# Patient Record
Sex: Female | Born: 1965 | Race: Black or African American | Hispanic: No | Marital: Single | Smoking: Current some day smoker
Health system: Southern US, Community
[De-identification: ages and names within clinical notes are randomized; demographics above are authoritative.]

## PROBLEM LIST (undated history)

## (undated) DIAGNOSIS — M545 Low back pain, unspecified: Secondary | ICD-10-CM

## (undated) DIAGNOSIS — I10 Essential (primary) hypertension: Secondary | ICD-10-CM

## (undated) DIAGNOSIS — M542 Cervicalgia: Secondary | ICD-10-CM

## (undated) DIAGNOSIS — F419 Anxiety disorder, unspecified: Secondary | ICD-10-CM

## (undated) DIAGNOSIS — J302 Other seasonal allergic rhinitis: Secondary | ICD-10-CM

## (undated) HISTORY — DX: Essential (primary) hypertension: I10

---

## 1995-08-31 HISTORY — PX: TUBAL LIGATION: SHX77

## 1997-10-20 ENCOUNTER — Encounter: Admission: RE | Admit: 1997-10-20 | Discharge: 1997-10-20 | Payer: Self-pay | Admitting: Family Medicine

## 1998-07-11 ENCOUNTER — Encounter: Admission: RE | Admit: 1998-07-11 | Discharge: 1998-07-11 | Payer: Self-pay | Admitting: Family Medicine

## 1998-07-13 ENCOUNTER — Encounter: Admission: RE | Admit: 1998-07-13 | Discharge: 1998-07-13 | Payer: Self-pay | Admitting: Family Medicine

## 1999-10-31 ENCOUNTER — Encounter (INDEPENDENT_AMBULATORY_CARE_PROVIDER_SITE_OTHER): Payer: Self-pay | Admitting: *Deleted

## 1999-11-23 ENCOUNTER — Encounter: Admission: RE | Admit: 1999-11-23 | Discharge: 1999-11-23 | Payer: Self-pay | Admitting: Family Medicine

## 1999-11-23 ENCOUNTER — Other Ambulatory Visit: Admission: RE | Admit: 1999-11-23 | Discharge: 1999-11-23 | Payer: Self-pay | Admitting: Family Medicine

## 2000-03-26 ENCOUNTER — Encounter: Admission: RE | Admit: 2000-03-26 | Discharge: 2000-03-26 | Payer: Self-pay | Admitting: Family Medicine

## 2000-09-05 ENCOUNTER — Encounter: Admission: RE | Admit: 2000-09-05 | Discharge: 2000-09-05 | Payer: Self-pay | Admitting: Family Medicine

## 2000-10-18 ENCOUNTER — Encounter: Admission: RE | Admit: 2000-10-18 | Discharge: 2000-10-18 | Payer: Self-pay | Admitting: Family Medicine

## 2000-12-06 ENCOUNTER — Encounter: Admission: RE | Admit: 2000-12-06 | Discharge: 2000-12-06 | Payer: Self-pay | Admitting: Family Medicine

## 2001-08-26 ENCOUNTER — Encounter: Admission: RE | Admit: 2001-08-26 | Discharge: 2001-08-26 | Payer: Self-pay | Admitting: Sports Medicine

## 2002-02-05 ENCOUNTER — Encounter: Admission: RE | Admit: 2002-02-05 | Discharge: 2002-02-05 | Payer: Self-pay | Admitting: Family Medicine

## 2002-03-22 ENCOUNTER — Encounter: Payer: Self-pay | Admitting: Emergency Medicine

## 2002-03-22 ENCOUNTER — Emergency Department (HOSPITAL_COMMUNITY): Admission: EM | Admit: 2002-03-22 | Discharge: 2002-03-22 | Payer: Self-pay | Admitting: Emergency Medicine

## 2002-03-25 ENCOUNTER — Ambulatory Visit (HOSPITAL_BASED_OUTPATIENT_CLINIC_OR_DEPARTMENT_OTHER): Admission: RE | Admit: 2002-03-25 | Discharge: 2002-03-25 | Payer: Self-pay | Admitting: *Deleted

## 2002-05-25 ENCOUNTER — Encounter: Admission: RE | Admit: 2002-05-25 | Discharge: 2002-05-25 | Payer: Self-pay | Admitting: Family Medicine

## 2002-06-05 ENCOUNTER — Encounter: Admission: RE | Admit: 2002-06-05 | Discharge: 2002-06-05 | Payer: Self-pay | Admitting: Family Medicine

## 2002-07-07 ENCOUNTER — Encounter: Admission: RE | Admit: 2002-07-07 | Discharge: 2002-07-07 | Payer: Self-pay | Admitting: Family Medicine

## 2003-02-01 ENCOUNTER — Encounter: Admission: RE | Admit: 2003-02-01 | Discharge: 2003-02-01 | Payer: Self-pay | Admitting: Family Medicine

## 2005-03-19 ENCOUNTER — Ambulatory Visit: Payer: Self-pay | Admitting: Sports Medicine

## 2005-05-05 ENCOUNTER — Emergency Department (HOSPITAL_COMMUNITY): Admission: EM | Admit: 2005-05-05 | Discharge: 2005-05-05 | Payer: Self-pay | Admitting: Emergency Medicine

## 2005-07-26 ENCOUNTER — Ambulatory Visit: Payer: Self-pay | Admitting: Sports Medicine

## 2005-07-27 ENCOUNTER — Emergency Department (HOSPITAL_COMMUNITY): Admission: EM | Admit: 2005-07-27 | Discharge: 2005-07-27 | Payer: Self-pay | Admitting: Emergency Medicine

## 2006-08-29 DIAGNOSIS — J309 Allergic rhinitis, unspecified: Secondary | ICD-10-CM | POA: Insufficient documentation

## 2006-08-30 ENCOUNTER — Encounter (INDEPENDENT_AMBULATORY_CARE_PROVIDER_SITE_OTHER): Payer: Self-pay | Admitting: *Deleted

## 2007-03-04 ENCOUNTER — Emergency Department (HOSPITAL_COMMUNITY): Admission: EM | Admit: 2007-03-04 | Discharge: 2007-03-04 | Payer: Self-pay | Admitting: *Deleted

## 2007-10-17 ENCOUNTER — Emergency Department (HOSPITAL_COMMUNITY): Admission: EM | Admit: 2007-10-17 | Discharge: 2007-10-17 | Payer: Self-pay | Admitting: Emergency Medicine

## 2008-02-09 ENCOUNTER — Telehealth: Payer: Self-pay | Admitting: *Deleted

## 2010-11-17 NOTE — Op Note (Signed)
   NAMEMARKEETA, Claire Fowler                        ACCOUNT NO.:  [id number]   MEDICAL RECORD NO.:  [id number]                   PATIENT TYPE:  AMB   LOCATION:  DSC                                  FACILITY:  [facility]   PHYSICIAN:  Lowell Bouton, M.D.      DATE OF BIRTH:  [birth year]   DATE OF PROCEDURE:  03/25/2002  DATE OF DISCHARGE:                                 OPERATIVE REPORT   PREOPERATIVE DIAGNOSIS:  Lacerated extensor tendon on the dorsum of the left  hand in line with the ring finger.   POSTOPERATIVE DIAGNOSIS:  Lacerated extensor tendon on the dorsum of the  left hand in line with the ring finger.   PROCEDURE:  Repair of extensor tendon on the dorsum of the left hand to the  ring finger.   SURGEON:  Lowell Bouton, M.D.   ANESTHESIA:  General.   OPERATIVE FINDINGS:  The patient had a complete transection of the extensor  digitorum communis on the left hand to the ring finger.   DESCRIPTION OF PROCEDURE:  Under general anesthesia with a tourniquet on the  left arm, the left hand was prepped and draped in the usual fashion and  after elevating the limb, the tourniquet was inflated to 250 mmHg.  The  previous sutures were removed and the wound was bluntly spread open.  It was  extended radially with a knife slightly.  Blunt dissection was carried down  to the extensor tendon and clotted blood was removed from the wound.  The  two ends of the tendon were separated by about 2 cm.  Ethibond 3-0 suture  was used in a Kessler fashion to repair the tendon end-to-end.  The repair  was then reinforced with simple sutures.  The wound was then irrigated with  saline.  The skin was closed with a 3-0 subcuticular Prolene, Steri-Strips  were applied, followed by sterile dressings and a volar splint with the  fingers extended on the ulnar three digits.  The tourniquet was released  with good circulation to the hand.  The patient went to the recovery room  awake and  stable, in good condition.                                                Lowell Bouton, M.D.    EMM/MEDQ  D:  03/25/2002  T:  03/26/2002  Job:  [id number]

## 2011-09-05 ENCOUNTER — Emergency Department (HOSPITAL_COMMUNITY): Payer: Self-pay

## 2011-09-05 ENCOUNTER — Encounter (HOSPITAL_COMMUNITY): Payer: Self-pay

## 2011-09-05 ENCOUNTER — Emergency Department (HOSPITAL_COMMUNITY)
Admission: EM | Admit: 2011-09-05 | Discharge: 2011-09-05 | Disposition: A | Discharge status: Home / Self Care | Payer: Self-pay | Attending: Emergency Medicine | Admitting: Emergency Medicine

## 2011-09-05 DIAGNOSIS — M7710 Lateral epicondylitis, unspecified elbow: Secondary | ICD-10-CM

## 2011-09-05 DIAGNOSIS — IMO0002 Reserved for concepts with insufficient information to code with codable children: Secondary | ICD-10-CM | POA: Insufficient documentation

## 2011-09-05 DIAGNOSIS — M25529 Pain in unspecified elbow: Secondary | ICD-10-CM | POA: Insufficient documentation

## 2011-09-05 DIAGNOSIS — Y9289 Other specified places as the place of occurrence of the external cause: Secondary | ICD-10-CM | POA: Insufficient documentation

## 2011-09-05 DIAGNOSIS — F17200 Nicotine dependence, unspecified, uncomplicated: Secondary | ICD-10-CM | POA: Insufficient documentation

## 2011-09-05 MED ORDER — IBUPROFEN 800 MG PO TABS
800.0000 mg | ORAL_TABLET | Freq: Once | ORAL | Status: AC
  Administered 2011-09-05: 800 mg via ORAL
  Filled 2011-09-05: qty 1

## 2011-09-05 MED ORDER — NAPROXEN 500 MG PO TABS: 500.0000 mg | ORAL_TABLET | Freq: Two times a day (BID) | ORAL | Status: AC

## 2011-09-05 NOTE — ED Notes (Signed)
Pt reports hitting her rt elbow on the wall this a.m.  Pt reports severe pain to the area.

## 2011-09-05 NOTE — Discharge Instructions (Signed)
Tennis Elbow  Your caregiver has diagnosed you with a condition often referred to as "tennis elbow." This results from small tears or soreness (inflammation) at the start (origin) of the extensor muscles of the forearm. Although the condition is often called tennis or golfer's elbow, it is caused by any repetitive action performed by your elbow.  HOME CARE INSTRUCTIONS   If the condition has been short lived, rest may be the only treatment required. Using your opposite hand or arm to perform the task may help. Even changing your grip may help rest the extremity. These may even prevent the condition from recurring.   Longer standing problems, however, will often be relieved faster by:   Using anti-inflammatory agents.   Applying ice packs for 30 minutes at the end of the working day, at bed time, or when activities are finished.   Your caregiver may also have you wear a splint or sling. This will allow the inflamed tendon to heal.  At times, steroid injections aided with a local anesthetic will be required along with splinting for 1 to 2 weeks. Two to three steroid injections will often solve the problem. In some long standing cases, the inflamed tendon does not respond to conservative (non-surgical) therapy. Then surgery may be required to repair it.  MAKE SURE YOU:    Understand these instructions.   Will watch your condition.   Will get help right away if you are not doing well or get worse.  Document Released: 06/18/2005 Document Revised: 06/07/2011 Document Reviewed: 02/04/2008  [employer] Patient Information 2012 [employer].
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
## 2011-09-09 NOTE — ED Provider Notes (Signed)
Medical screening examination/treatment/procedure(s) were performed by non-physician practitioner and as supervising physician I was immediately available for consultation/collaboration.   Shelda Jakes, MD 09/09/11 2043

## 2014-10-11 ENCOUNTER — Observation Stay (HOSPITAL_COMMUNITY)
Admission: EM | Admit: 2014-10-11 | Discharge: 2014-10-12 | Disposition: A | Discharge status: Home / Self Care | Payer: No Typology Code available for payment source | Attending: Internal Medicine | Admitting: Internal Medicine

## 2014-10-11 ENCOUNTER — Emergency Department (HOSPITAL_COMMUNITY): Payer: No Typology Code available for payment source

## 2014-10-11 ENCOUNTER — Encounter (HOSPITAL_COMMUNITY): Payer: Self-pay | Admitting: Cardiology

## 2014-10-11 DIAGNOSIS — R9431 Abnormal electrocardiogram [ECG] [EKG]: Secondary | ICD-10-CM | POA: Diagnosis not present

## 2014-10-11 DIAGNOSIS — Z720 Tobacco use: Secondary | ICD-10-CM | POA: Insufficient documentation

## 2014-10-11 DIAGNOSIS — R55 Syncope and collapse: Secondary | ICD-10-CM | POA: Diagnosis not present

## 2014-10-11 DIAGNOSIS — S299XXA Unspecified injury of thorax, initial encounter: Principal | ICD-10-CM | POA: Insufficient documentation

## 2014-10-11 DIAGNOSIS — F419 Anxiety disorder, unspecified: Secondary | ICD-10-CM

## 2014-10-11 DIAGNOSIS — S0990XA Unspecified injury of head, initial encounter: Secondary | ICD-10-CM | POA: Diagnosis not present

## 2014-10-11 DIAGNOSIS — I10 Essential (primary) hypertension: Secondary | ICD-10-CM

## 2014-10-11 DIAGNOSIS — Y9389 Activity, other specified: Secondary | ICD-10-CM | POA: Insufficient documentation

## 2014-10-11 DIAGNOSIS — S199XXA Unspecified injury of neck, initial encounter: Secondary | ICD-10-CM | POA: Insufficient documentation

## 2014-10-11 DIAGNOSIS — S3991XA Unspecified injury of abdomen, initial encounter: Secondary | ICD-10-CM | POA: Diagnosis not present

## 2014-10-11 DIAGNOSIS — Y998 Other external cause status: Secondary | ICD-10-CM | POA: Insufficient documentation

## 2014-10-11 DIAGNOSIS — Y92410 Unspecified street and highway as the place of occurrence of the external cause: Secondary | ICD-10-CM | POA: Insufficient documentation

## 2014-10-11 HISTORY — DX: Other seasonal allergic rhinitis: J30.2

## 2014-10-11 HISTORY — DX: Anxiety disorder, unspecified: F41.9

## 2014-10-11 HISTORY — DX: Essential (primary) hypertension: I10

## 2014-10-11 LAB — CBC WITH DIFFERENTIAL/PLATELET
BASOS PCT: 0 % (ref 0–1)
Basophils Absolute: 0 10*3/uL (ref 0.0–0.1)
EOS PCT: 0 % (ref 0–5)
Eosinophils Absolute: 0 10*3/uL (ref 0.0–0.7)
HCT: 34.2 % — ABNORMAL LOW (ref 36.0–46.0)
HEMOGLOBIN: 11.1 g/dL — AB (ref 12.0–15.0)
LYMPHS PCT: 23 % (ref 12–46)
Lymphs Abs: 2.2 10*3/uL (ref 0.7–4.0)
MCH: 22.5 pg — ABNORMAL LOW (ref 26.0–34.0)
MCHC: 32.5 g/dL (ref 30.0–36.0)
MCV: 69.2 fL — ABNORMAL LOW (ref 78.0–100.0)
MONO ABS: 0.5 10*3/uL (ref 0.1–1.0)
MONOS PCT: 5 % (ref 3–12)
Neutro Abs: 6.7 10*3/uL (ref 1.7–7.7)
Neutrophils Relative %: 72 % (ref 43–77)
Platelets: 361 10*3/uL (ref 150–400)
RBC: 4.94 MIL/uL (ref 3.87–5.11)
RDW: 14.4 % (ref 11.5–15.5)
WBC: 9.4 10*3/uL (ref 4.0–10.5)

## 2014-10-11 LAB — BASIC METABOLIC PANEL
Anion gap: 9 (ref 5–15)
BUN: 11 mg/dL (ref 6–23)
CHLORIDE: 106 mmol/L (ref 96–112)
CO2: 22 mmol/L (ref 19–32)
Calcium: 8.7 mg/dL (ref 8.4–10.5)
Creatinine, Ser: 0.97 mg/dL (ref 0.50–1.10)
GFR calc Af Amer: 79 mL/min — ABNORMAL LOW (ref >90)
GFR, EST NON AFRICAN AMERICAN: 68 mL/min — AB (ref >90)
GLUCOSE: 109 mg/dL — AB (ref 70–99)
POTASSIUM: 3.8 mmol/L (ref 3.5–5.1)
SODIUM: 137 mmol/L (ref 135–145)

## 2014-10-11 LAB — TROPONIN I: Troponin I: <0.03 ng/mL (ref <0.031)

## 2014-10-11 MED ORDER — SODIUM CHLORIDE 0.9 % IV SOLN
INTRAVENOUS | Status: DC
  Administered 2014-10-11: 23:00:00 via INTRAVENOUS

## 2014-10-11 MED ORDER — ACETAMINOPHEN 650 MG RE SUPP: 650.0000 mg | Freq: Four times a day (QID) | RECTAL | Status: DC | PRN

## 2014-10-11 MED ORDER — ACETAMINOPHEN 325 MG PO TABS
650.0000 mg | ORAL_TABLET | Freq: Four times a day (QID) | ORAL | Status: DC | PRN
  Administered 2014-10-12: 650 mg via ORAL
  Filled 2014-10-11: qty 2

## 2014-10-11 MED ORDER — ONDANSETRON HCL 4 MG/2ML IJ SOLN: 4.0000 mg | Freq: Four times a day (QID) | INTRAMUSCULAR | Status: DC | PRN

## 2014-10-11 MED ORDER — ALPRAZOLAM 0.5 MG PO TABS
0.5000 mg | ORAL_TABLET | Freq: Three times a day (TID) | ORAL | Status: DC | PRN
  Administered 2014-10-12: 0.5 mg via ORAL
  Filled 2014-10-11: qty 1

## 2014-10-11 MED ORDER — IOHEXOL 300 MG/ML  SOLN
100.0000 mL | Freq: Once | INTRAMUSCULAR | Status: AC | PRN
  Administered 2014-10-11: 100 mL via INTRAVENOUS

## 2014-10-11 MED ORDER — SODIUM CHLORIDE 0.9 % IV BOLUS (SEPSIS)
500.0000 mL | Freq: Once | INTRAVENOUS | Status: AC
  Administered 2014-10-11: 500 mL via INTRAVENOUS

## 2014-10-11 MED ORDER — ONDANSETRON HCL 4 MG PO TABS: 4.0000 mg | ORAL_TABLET | Freq: Four times a day (QID) | ORAL | Status: DC | PRN

## 2014-10-11 MED ORDER — ENOXAPARIN SODIUM 40 MG/0.4ML ~~LOC~~ SOLN
40.0000 mg | SUBCUTANEOUS | Status: DC
  Administered 2014-10-12: 40 mg via SUBCUTANEOUS
  Filled 2014-10-11: qty 0.4

## 2014-10-11 MED ORDER — LORATADINE 10 MG PO TABS
10.0000 mg | ORAL_TABLET | Freq: Every day | ORAL | Status: DC
  Administered 2014-10-12: 10 mg via ORAL
  Filled 2014-10-11: qty 1

## 2014-10-11 MED ORDER — ACETAMINOPHEN 325 MG PO TABS
650.0000 mg | ORAL_TABLET | Freq: Once | ORAL | Status: AC
  Administered 2014-10-11: 650 mg via ORAL
  Filled 2014-10-11: qty 2
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
## 2014-10-12 ENCOUNTER — Encounter (HOSPITAL_COMMUNITY): Payer: Self-pay | Admitting: Adult Health

## 2014-10-12 DIAGNOSIS — R9431 Abnormal electrocardiogram [ECG] [EKG]: Secondary | ICD-10-CM

## 2014-10-12 DIAGNOSIS — R55 Syncope and collapse: Secondary | ICD-10-CM

## 2014-10-12 DIAGNOSIS — F419 Anxiety disorder, unspecified: Secondary | ICD-10-CM

## 2014-10-12 DIAGNOSIS — I10 Essential (primary) hypertension: Secondary | ICD-10-CM

## 2014-10-12 LAB — CBC
HCT: 33.1 % — ABNORMAL LOW (ref 36.0–46.0)
HEMOGLOBIN: 10.5 g/dL — AB (ref 12.0–15.0)
MCH: 22.2 pg — AB (ref 26.0–34.0)
MCHC: 31.7 g/dL (ref 30.0–36.0)
MCV: 70.1 fL — ABNORMAL LOW (ref 78.0–100.0)
Platelets: 336 10*3/uL (ref 150–400)
RBC: 4.72 MIL/uL (ref 3.87–5.11)
RDW: 14.5 % (ref 11.5–15.5)
WBC: 6.8 10*3/uL (ref 4.0–10.5)

## 2014-10-12 LAB — COMPREHENSIVE METABOLIC PANEL
ALK PHOS: 44 U/L (ref 39–117)
ALT: 17 U/L (ref 0–35)
AST: 19 U/L (ref 0–37)
Albumin: 3.5 g/dL (ref 3.5–5.2)
Anion gap: 8 (ref 5–15)
BILIRUBIN TOTAL: 0.2 mg/dL — AB (ref 0.3–1.2)
BUN: 9 mg/dL (ref 6–23)
CO2: 23 mmol/L (ref 19–32)
Calcium: 8.1 mg/dL — ABNORMAL LOW (ref 8.4–10.5)
Chloride: 108 mmol/L (ref 96–112)
Creatinine, Ser: 0.93 mg/dL (ref 0.50–1.10)
GFR calc Af Amer: 84 mL/min — ABNORMAL LOW (ref >90)
GFR calc non Af Amer: 72 mL/min — ABNORMAL LOW (ref >90)
Glucose, Bld: 107 mg/dL — ABNORMAL HIGH (ref 70–99)
Potassium: 3.6 mmol/L (ref 3.5–5.1)
Sodium: 139 mmol/L (ref 135–145)
TOTAL PROTEIN: 6.2 g/dL (ref 6.0–8.3)

## 2014-10-12 LAB — TROPONIN I
Troponin I: <0.03 ng/mL (ref <0.031)
Troponin I: <0.03 ng/mL (ref <0.031)
Troponin I: <0.03 ng/mL (ref <0.031)

## 2014-10-12 MED ORDER — IBUPROFEN 600 MG PO TABS
600.0000 mg | ORAL_TABLET | Freq: Four times a day (QID) | ORAL | Status: DC | PRN
  Administered 2014-10-12: 600 mg via ORAL
  Filled 2014-10-12: qty 1

## 2014-10-12 NOTE — Care Management Note (Signed)
    Page 1 of 1   10/12/2014     2:23:58 PM CARE MANAGEMENT NOTE 10/12/2014  Patient:  Sheffield SliderCROW,Claire Fowler   Account Number:  [id number]  Date Initiated:  10/12/2014  Documentation initiated by:  Sharrie RothmanBLACKWELL,Venesha Petraitis C  Subjective/Objective Assessment:   Pt admitted from home with syncope. Pt lives with significant other and will return home at discharge. Pt is independent with ADL's.     Action/Plan:   [facility] and [facility]. [facility] Financial counselor aware of self pay status.   Anticipated DC Date:  10/13/2014   Anticipated DC Plan:  HOME/SELF CARE  In-house referral  Financial Counselor      DC Planning Services  CM consult      Choice offered to / List presented to:             Status of service:  Completed, signed off Medicare Important Message given?   (If response is "NO", the following Medicare IM given date fields will be blank) Date Medicare IM given:   Medicare IM given by:   Date Additional Medicare IM given:   Additional Medicare IM given by:    Discharge Disposition:  HOME/SELF CARE  Per UR Regulation:    If discussed at Long Length of Stay Meetings, dates discussed:    Comments:  10/12/14 1415 Arlyss Queenammy Barbara Ahart, RN BSN CM

## 2014-10-12 NOTE — Progress Notes (Signed)
  Echocardiogram 2D Echocardiogram has been performed.  Stacey DrainWhite, Uliana Brinker J 10/12/2014, 4:28 PM

## 2014-10-12 NOTE — Progress Notes (Signed)
UR completed 
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
## 2014-10-12 NOTE — Progress Notes (Signed)
Patient in stable condition at discharge.  Patient received discharge instructions and had no further questions or concerns.  Patient escorted to main entrance via wheelchair by nurse tech.

## 2014-10-12 NOTE — Progress Notes (Signed)
    Please see echocardiogram report - vigorous LVEF without wall motion abnormalities, normal RV contraction, and no pericardial effusion. Orthostatic vital signs not significant. Troponin I negative x 3. If no arrhythmias on telemetry since we evaluated her this morning, can consider discharge. Possibly had neurocardiogenic syncope. She needs to establish soon with a PCP in the area, and will also likely need an outpatient stress test for further cardiac screening in light of abnormal ECG. We will sign off - can arrange followup with us for stress testing as outpatient.   Jonelle SidleSamuel G. Prescilla Monger, M.D., F.A.C.C.
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
## 2014-10-27 ENCOUNTER — Ambulatory Visit (INDEPENDENT_AMBULATORY_CARE_PROVIDER_SITE_OTHER): Payer: Self-pay | Admitting: Physician Assistant

## 2014-10-27 ENCOUNTER — Encounter: Payer: Self-pay | Admitting: *Deleted

## 2014-10-27 ENCOUNTER — Encounter: Payer: Self-pay | Admitting: Physician Assistant

## 2014-10-27 VITALS — BP 142/78 | HR 99 | Ht 63.0 in | Wt 171.0 lb

## 2014-10-27 DIAGNOSIS — Z136 Encounter for screening for cardiovascular disorders: Secondary | ICD-10-CM

## 2014-10-27 DIAGNOSIS — R9431 Abnormal electrocardiogram [ECG] [EKG]: Secondary | ICD-10-CM

## 2014-10-27 DIAGNOSIS — I10 Essential (primary) hypertension: Secondary | ICD-10-CM

## 2014-10-27 DIAGNOSIS — R55 Syncope and collapse: Secondary | ICD-10-CM

## 2014-10-27 MED ORDER — BENAZEPRIL HCL 10 MG PO TABS: 10.0000 mg | ORAL_TABLET | Freq: Every day | ORAL | Status: DC

## 2014-10-27 NOTE — Assessment & Plan Note (Signed)
Patient has had another episode of syncope but without for 2 hours. Recommend referral to neurology. She's had associated severe headaches. We'll check stress Myoview to rule out ischemia but doubt this is the cause of her symptoms.

## 2014-10-27 NOTE — Addendum Note (Signed)
Addended by: Kerney ElbePINNIX, LUKISHA G on: 10/27/2014 03:54 PM   Modules accepted: Orders
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
## 2014-10-27 NOTE — Assessment & Plan Note (Signed)
2-D echo shows vigorous LV function. Will order stress Myoview to rule out ischemia. Follow-up with Dr. Diona BrownerMcDowell.

## 2014-10-27 NOTE — Patient Instructions (Addendum)
Your physician recommends that you schedule a follow-up appointment in: 1 month with Dr. Diona BrownerMcDowell  Your physician has requested that you have en exercise stress myoview. For further information please visit [email or web address]. Please follow instruction sheet, as given.  You have been given a copy of a 2 GM Salt Diet   Start Lotensin 10 mg Daily   Thank you for choosing [facility]!

## 2014-10-27 NOTE — Assessment & Plan Note (Signed)
Blood pressure is elevated and she is having significant headaches. Start Lotensin 10 mg once daily. 2 g sodium diet. Weight loss program recommended.

## 2014-11-01 ENCOUNTER — Encounter: Payer: Self-pay | Admitting: Adult Health

## 2014-11-03 ENCOUNTER — Telehealth: Payer: Self-pay | Admitting: Physician Assistant

## 2014-11-03 ENCOUNTER — Other Ambulatory Visit: Payer: Self-pay

## 2014-11-03 DIAGNOSIS — R51 Headache: Secondary | ICD-10-CM

## 2014-11-03 DIAGNOSIS — R55 Syncope and collapse: Secondary | ICD-10-CM

## 2014-11-03 DIAGNOSIS — R519 Headache, unspecified: Secondary | ICD-10-CM

## 2014-11-03 NOTE — Telephone Encounter (Signed)
[facility]-C referral placed to neurology,Terry Goins aware

## 2014-11-03 NOTE — Telephone Encounter (Signed)
Patient states that she was told she was being referred to Neurologist. There is no referral in system.  Can you verify this. / tg

## 2014-11-05 ENCOUNTER — Encounter (HOSPITAL_COMMUNITY): Payer: Self-pay

## 2014-11-05 ENCOUNTER — Ambulatory Visit (HOSPITAL_COMMUNITY): Admission: RE | Admit: 2014-11-05 | Payer: Self-pay | Source: Ambulatory Visit

## 2014-11-09 ENCOUNTER — Encounter (HOSPITAL_COMMUNITY): Payer: Self-pay | Admitting: *Deleted

## 2014-11-09 ENCOUNTER — Emergency Department (HOSPITAL_COMMUNITY)
Admission: EM | Admit: 2014-11-09 | Discharge: 2014-11-09 | Disposition: A | Discharge status: Home / Self Care | Payer: No Typology Code available for payment source | Attending: Emergency Medicine | Admitting: Emergency Medicine

## 2014-11-09 DIAGNOSIS — Z8659 Personal history of other mental and behavioral disorders: Secondary | ICD-10-CM | POA: Diagnosis not present

## 2014-11-09 DIAGNOSIS — Z79899 Other long term (current) drug therapy: Secondary | ICD-10-CM | POA: Insufficient documentation

## 2014-11-09 DIAGNOSIS — M549 Dorsalgia, unspecified: Secondary | ICD-10-CM | POA: Diagnosis present

## 2014-11-09 DIAGNOSIS — Z720 Tobacco use: Secondary | ICD-10-CM | POA: Diagnosis not present

## 2014-11-09 DIAGNOSIS — M545 Low back pain, unspecified: Secondary | ICD-10-CM

## 2014-11-09 MED ORDER — CYCLOBENZAPRINE HCL 10 MG PO TABS: 10.0000 mg | ORAL_TABLET | Freq: Three times a day (TID) | ORAL | Status: DC | PRN

## 2014-11-09 MED ORDER — TRAMADOL HCL 50 MG PO TABS: 50.0000 mg | ORAL_TABLET | Freq: Four times a day (QID) | ORAL | Status: DC | PRN

## 2014-11-09 NOTE — Discharge Instructions (Signed)
Back Pain, Adult °Back pain is very common. The pain often gets better over time. The cause of back pain is usually not dangerous. Most people can learn to manage their back pain on their own.  °HOME CARE  °· Stay active. Start with short walks on flat ground if you can. Try to walk farther each day. °· Do not sit, drive, or stand in one place for more than 30 minutes. Do not stay in bed. °· Do not avoid exercise or work. Activity can help your back heal faster. °· Be careful when you bend or lift an object. Bend at your knees, keep the object close to you, and do not twist. °· Sleep on a firm mattress. Lie on your side, and bend your knees. If you lie on your back, put a pillow under your knees. °· Only take medicines as told by your doctor. °· Put ice on the injured area. °¨ Put ice in a plastic bag. °¨ Place a towel between your skin and the bag. °¨ Leave the ice on for 15-20 minutes, 03-04 times a day for the first 2 to 3 days. After that, you can switch between ice and heat packs. °· Ask your doctor about back exercises or massage. °· Avoid feeling anxious or stressed. Find good ways to deal with stress, such as exercise. °GET HELP RIGHT AWAY IF:  °· Your pain does not go away with rest or medicine. °· Your pain does not go away in 1 week. °· You have new problems. °· You do not feel well. °· The pain spreads into your legs. °· You cannot control when you poop (bowel movement) or pee (urinate). °· Your arms or legs feel weak or lose feeling (numbness). °· You feel sick to your stomach (nauseous) or throw up (vomit). °· You have belly (abdominal) pain. °· You feel like you may pass out (faint). °MAKE SURE YOU:  °· Understand these instructions. °· Will watch your condition. °· Will get help right away if you are not doing well or get worse. °Document Released: 12/05/2007 Document Revised: 09/10/2011 Document Reviewed: 10/20/2013 °[employer]® Patient Information ©2015 [employer]. This information is not intended  to replace advice given to you by your health care provider. Make sure you discuss any questions you have with your health care provider. ° °
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
## 2014-11-12 ENCOUNTER — Encounter (HOSPITAL_COMMUNITY): Admission: RE | Admit: 2014-11-12 | Payer: No Typology Code available for payment source | Source: Ambulatory Visit

## 2014-11-12 ENCOUNTER — Encounter (HOSPITAL_COMMUNITY): Payer: No Typology Code available for payment source

## 2014-11-12 ENCOUNTER — Inpatient Hospital Stay (HOSPITAL_COMMUNITY): Admission: RE | Admit: 2014-11-12 | Payer: Self-pay | Source: Ambulatory Visit

## 2014-12-01 ENCOUNTER — Ambulatory Visit: Payer: Self-pay | Admitting: Neurology

## 2014-12-02 ENCOUNTER — Encounter: Payer: Self-pay | Admitting: Neurology

## 2015-04-11 ENCOUNTER — Ambulatory Visit: Payer: Self-pay | Admitting: Physician Assistant

## 2015-04-11 ENCOUNTER — Encounter: Payer: Self-pay | Admitting: Physician Assistant

## 2015-04-11 VITALS — BP 162/82 | HR 90 | Temp 97.9°F | Ht 64.0 in | Wt 173.2 lb

## 2015-04-11 DIAGNOSIS — Z5321 Procedure and treatment not carried out due to patient leaving prior to being seen by health care provider: Secondary | ICD-10-CM

## 2015-04-11 NOTE — Progress Notes (Signed)
Patient ID: Claire Fowler, female   DOB: Jan 21, 1966, 49 y.o.   MRN: [id number]   Pt's vitals obtained and OV interrupted due to computer problems in the office.  Pt needed to reschedule as she had to leave for another reason and could not wait

## 2015-04-26 ENCOUNTER — Ambulatory Visit: Payer: Self-pay | Admitting: Physician Assistant

## 2015-04-26 ENCOUNTER — Encounter: Payer: Self-pay | Admitting: Physician Assistant

## 2015-04-26 VITALS — BP 170/92 | HR 89 | Temp 97.9°F | Ht 64.0 in | Wt 173.9 lb

## 2015-04-26 DIAGNOSIS — M545 Low back pain: Secondary | ICD-10-CM

## 2015-04-26 DIAGNOSIS — H6122 Impacted cerumen, left ear: Secondary | ICD-10-CM

## 2015-04-26 DIAGNOSIS — Z131 Encounter for screening for diabetes mellitus: Secondary | ICD-10-CM

## 2015-04-26 DIAGNOSIS — I10 Essential (primary) hypertension: Secondary | ICD-10-CM

## 2015-04-26 DIAGNOSIS — D649 Anemia, unspecified: Secondary | ICD-10-CM

## 2015-04-26 DIAGNOSIS — Z13220 Encounter for screening for lipoid disorders: Secondary | ICD-10-CM

## 2015-04-26 LAB — POCT URINALYSIS DIPSTICK
Bilirubin, UA: NEGATIVE
Glucose, UA: NEGATIVE
Ketones, UA: NEGATIVE
Leukocytes, UA: NEGATIVE
NITRITE UA: NEGATIVE
PH UA: 5.5
PROTEIN UA: NEGATIVE
RBC UA: NEGATIVE
Spec Grav, UA: 1.015
Urobilinogen, UA: 0.2

## 2015-04-26 LAB — GLUCOSE, POCT (MANUAL RESULT ENTRY): POC GLUCOSE: 85 mg/dL (ref 70–99)

## 2015-04-26 MED ORDER — BENAZEPRIL HCL 20 MG PO TABS: 20.0000 mg | ORAL_TABLET | Freq: Every day | ORAL | Status: DC

## 2015-04-26 NOTE — Progress Notes (Signed)
BP 170/92 mmHg  Pulse 89  Temp(Src) 97.9 F (36.6 C)  Ht 5\' 4"  (1.626 m)  Wt 173 lb 14.4 oz (78.881 kg)  BMI 29.84 kg/m2  SpO2 99%   Subjective:    Patient ID: Claire Fowler Orren, female    DOB: 07/25/1965, 49 y.o.   MRN: [id number]  HPI: Claire Fowler Nong is a 49 y.o. female presenting on 04/26/2015 for New Patient (Initial Visit); Back Pain; Hypertension; Eye Problem; and Dental Pain   Back Pain Associated symptoms include headaches. Pertinent negatives include no abdominal pain, chest pain, dysuria or fever.  Hypertension Associated symptoms include headaches. Pertinent negatives include no chest pain, palpitations or shortness of breath.  Eye Problem  Associated symptoms include eye redness. Pertinent negatives include no eye discharge, fever or vomiting.  Dental Pain  Pertinent negatives include no fever.  pt had insurance until about 2 year.   Out of her bp meds x 2 mo.  Chief Complaint  Patient presents with  . New Patient (Initial Visit)  . Back Pain    pt feels lower back pain for about 3-4 months. pt states it is her kidneys. pt denies pain or problems when urinating. pt states pain is very irritating and sharp. pt takes otc med to relieve pain, but they are not effective.   . Hypertension    pt states she has had a constant HA that started 4 months ago. pain in on her L tempora area. pt states she thinks it is due to her BP being high. pt states she ran out of her BP med in august.  . Eye Problem    pt feels pain on L eye when she touch it. R eye does not hurt. pt states she has not had glasses for about a year and need to get an eye exam  . Dental Pain    pt has a sensitive teeth to cold and hot touch. pt was explained [facility] before she is put on the dental list.    Relevant past medical, surgical, family and social history reviewed and updated as indicated. Interim medical history since our last visit reviewed. Allergies and  medications reviewed and updated.   Current outpatient prescriptions:  .  diphenhydrAMINE (BENADRYL) 25 MG tablet, Take 50 mg by mouth at bedtime., Disp: , Rfl:  .  ibuprofen (ADVIL,MOTRIN) 200 MG tablet, Take 800 mg by mouth daily as needed for mild pain or moderate pain., Disp: , Rfl:  .  loratadine (CLARITIN) 10 MG tablet, Take 10 mg by mouth daily., Disp: , Rfl:  .  benazepril (LOTENSIN) 10 MG tablet, Take 1 tablet (10 mg total) by mouth daily. (Patient not taking: Reported on 04/26/2015), Disp: 90 tablet, Rfl: 3   Review of Systems  Constitutional: Positive for unexpected weight change. Negative for fever, chills, diaphoresis, appetite change and fatigue.  HENT: Positive for dental problem, ear pain, facial swelling, sneezing, sore throat and trouble swallowing. Negative for congestion, drooling, hearing loss, mouth sores and voice change.   Eyes: Positive for pain, redness, itching and visual disturbance. Negative for discharge.  Respiratory: Positive for cough and choking. Negative for shortness of breath and wheezing.   Cardiovascular: Negative for chest pain, palpitations and leg swelling.  Gastrointestinal: Negative for vomiting, abdominal pain, diarrhea, constipation and blood in stool.  Endocrine: Negative for cold intolerance, heat intolerance and polydipsia.  Genitourinary: Negative for dysuria, hematuria and decreased urine volume.  Musculoskeletal:  Positive for back pain. Negative for arthralgias and gait problem.  Skin: Negative for rash.  Allergic/Immunologic: Positive for environmental allergies.  Neurological: Positive for headaches. Negative for seizures, syncope and light-headedness.  Hematological: Negative for adenopathy.  Psychiatric/Behavioral: Positive for dysphoric mood and agitation. Negative for suicidal ideas. The patient is not nervous/anxious.     Per HPI unless specifically indicated above     Objective:    BP 170/92 mmHg  Pulse 89  Temp(Src) 97.9  F (36.6 C)  Ht  (1.626 m)  Wt 173 lb 14.4 oz (78.881 kg)  BMI 29.84 kg/m2  SpO2 99%  Wt Readings from Last 3 Encounters:  04/26/15 173 lb 14.4 oz (78.881 kg)  04/11/15 173 lb 3.2 oz (78.563 kg)  11/09/14 170 lb (77.111 kg)    Physical Exam  Constitutional: She is oriented to person, place, and time. She appears well-developed and well-nourished.  No facial swelling  HENT:  Head: Normocephalic and atraumatic.  Right Ear: External ear normal.  Mouth/Throat: Oropharynx is clear and moist. No oropharyngeal exudate.  L ear with cerumen impaction  Eyes: Conjunctivae and EOM are normal. Pupils are equal, round, and reactive to light. Right eye exhibits no discharge. Left eye exhibits no discharge.  Neck: Neck supple. No thyromegaly present.  Cardiovascular: Normal rate and regular rhythm.   Pulmonary/Chest: Effort normal and breath sounds normal.  Abdominal: Soft. Bowel sounds are normal. She exhibits no mass. There is no tenderness.  Musculoskeletal: She exhibits no edema.  Lymphadenopathy:    She has no cervical adenopathy.  Neurological: She is alert and oriented to person, place, and time. Gait normal.  Skin: Skin is warm and dry.  Psychiatric: She has a normal mood and affect. Her behavior is normal.  Vitals reviewed.   Results for orders placed or performed in visit on 04/26/15  POCT Glucose (CBG)  Result Value Ref Range   POC Glucose 85 70 - 99 mg/dl      Assessment & Plan:   Encounter Diagnoses  Name Primary?  . Essential hypertension Yes  . Anemia, unspecified   . Screening for diabetes mellitus   . Screening cholesterol level   . Cerumen impaction, left   . Bilateral low back pain, with sciatica presence unspecified       -rx benazepril for htn -Get baseline labs -Put on dental list -Discussed with pt no free eye exams available (except for diabetic patients) -f/u 1 mo. rto sooner prn

## 2015-05-19 ENCOUNTER — Other Ambulatory Visit: Payer: Self-pay | Admitting: Physician Assistant

## 2015-05-19 LAB — COMPLETE METABOLIC PANEL WITH GFR
ALBUMIN: 4.2 g/dL (ref 3.6–5.1)
ALK PHOS: 64 U/L (ref 33–115)
ALT: 14 U/L (ref 6–29)
AST: 20 U/L (ref 10–35)
BILIRUBIN TOTAL: 0.3 mg/dL (ref 0.2–1.2)
BUN: 10 mg/dL (ref 7–25)
CALCIUM: 9 mg/dL (ref 8.6–10.2)
CO2: 25 mmol/L (ref 20–31)
CREATININE: 0.83 mg/dL (ref 0.50–1.10)
Chloride: 103 mmol/L (ref 98–110)
GFR, Est Non African American: 84 mL/min (ref >=60)
Glucose, Bld: 84 mg/dL (ref 65–99)
POTASSIUM: 4.1 mmol/L (ref 3.5–5.3)
Sodium: 138 mmol/L (ref 135–146)
TOTAL PROTEIN: 6.8 g/dL (ref 6.1–8.1)

## 2015-05-19 LAB — CBC WITH DIFFERENTIAL/PLATELET
BASOS PCT: 0 % (ref 0–1)
Basophils Absolute: 0 10*3/uL (ref 0.0–0.1)
EOS ABS: 0.1 10*3/uL (ref 0.0–0.7)
EOS PCT: 1 % (ref 0–5)
HCT: 38.7 % (ref 36.0–46.0)
Hemoglobin: 12 g/dL (ref 12.0–15.0)
Lymphocytes Relative: 34 % (ref 12–46)
Lymphs Abs: 2.4 10*3/uL (ref 0.7–4.0)
MCH: 22.1 pg — AB (ref 26.0–34.0)
MCHC: 31 g/dL (ref 30.0–36.0)
MCV: 71.1 fL — ABNORMAL LOW (ref 78.0–100.0)
MPV: 10.5 fL (ref 8.6–12.4)
Monocytes Absolute: 0.6 10*3/uL (ref 0.1–1.0)
Monocytes Relative: 8 % (ref 3–12)
Neutro Abs: 4.1 10*3/uL (ref 1.7–7.7)
Neutrophils Relative %: 57 % (ref 43–77)
PLATELETS: 340 10*3/uL (ref 150–400)
RBC: 5.44 MIL/uL — ABNORMAL HIGH (ref 3.87–5.11)
RDW: 16.3 % — AB (ref 11.5–15.5)
WBC: 7.2 10*3/uL (ref 4.0–10.5)

## 2015-05-19 LAB — LIPID PANEL
CHOLESTEROL: 178 mg/dL (ref 125–200)
HDL: 45 mg/dL — ABNORMAL LOW (ref >=46)
LDL Cholesterol: 73 mg/dL (ref <130)
Total CHOL/HDL Ratio: 4 Ratio (ref <=5.0)
Triglycerides: 302 mg/dL — ABNORMAL HIGH (ref <150)
VLDL: 60 mg/dL — ABNORMAL HIGH (ref <30)

## 2015-05-19 LAB — TSH: TSH: 2.738 u[IU]/mL (ref 0.350–4.500)

## 2015-05-20 LAB — HEMOGLOBIN A1C
HEMOGLOBIN A1C: 5.8 % — AB (ref <5.7)
MEAN PLASMA GLUCOSE: 120 mg/dL — AB (ref <117)

## 2015-06-07 ENCOUNTER — Encounter: Payer: Self-pay | Admitting: Physician Assistant

## 2015-06-07 ENCOUNTER — Ambulatory Visit: Payer: Self-pay | Admitting: Physician Assistant

## 2015-06-07 VITALS — BP 160/86 | HR 90 | Temp 99.9°F | Ht 64.0 in | Wt 170.5 lb

## 2015-06-07 DIAGNOSIS — I10 Essential (primary) hypertension: Secondary | ICD-10-CM | POA: Insufficient documentation

## 2015-06-07 DIAGNOSIS — E781 Pure hyperglyceridemia: Secondary | ICD-10-CM

## 2015-06-07 DIAGNOSIS — F17210 Nicotine dependence, cigarettes, uncomplicated: Secondary | ICD-10-CM | POA: Insufficient documentation

## 2015-06-07 MED ORDER — BENAZEPRIL HCL 40 MG PO TABS: 40.0000 mg | ORAL_TABLET | Freq: Every day | ORAL | Status: DC

## 2015-06-07 NOTE — Progress Notes (Signed)
BP 160/86 mmHg  Pulse 90  Temp(Src) 99.9 F (37.7 C)  Ht 5' 4"  (1.626 m)  Wt 170 lb 8 oz (77.338 kg)  BMI 29.25 kg/m2  SpO2 98%   Subjective:    Patient ID: Claire Fowler, female    DOB: 01-Oct-1965, 49 y.o.   MRN: [id number]  HPI: Claire Fowler is a 49 y.o. female presenting on 06/07/2015 for Hypertension   HPI Chief Complaint  Patient presents with  . Hypertension    pt states she took her BP med today    Relevant past medical, surgical, family and social history reviewed and updated as indicated. Interim medical history since our last visit reviewed. Allergies and medications reviewed and updated.  Current outpatient prescriptions:  .  benazepril (LOTENSIN) 20 MG tablet, Take 1 tablet (20 mg total) by mouth daily., Disp: 30 tablet, Rfl: 3 .  diphenhydrAMINE (BENADRYL) 25 MG tablet, Take 50 mg by mouth at bedtime., Disp: , Rfl:  .  ferrous fumarate (HEMOCYTE - 106 MG FE) 325 (106 FE) MG TABS tablet, Take 1 tablet by mouth., Disp: , Rfl:  .  Fexofenadine HCl (MUCINEX ALLERGY PO), Take by mouth daily as needed., Disp: , Rfl:  .  ibuprofen (ADVIL,MOTRIN) 200 MG tablet, Take 800 mg by mouth daily as needed for mild pain or moderate pain., Disp: , Rfl:  .  loratadine (CLARITIN) 10 MG tablet, Take 10 mg by mouth daily., Disp: , Rfl:    Review of Systems  Constitutional: Positive for appetite change and unexpected weight change. Negative for fever, chills, diaphoresis and fatigue.  HENT: Positive for congestion, dental problem, sneezing, sore throat and trouble swallowing. Negative for drooling, ear pain, facial swelling, hearing loss, mouth sores and voice change.   Eyes: Negative for pain, discharge, redness, itching and visual disturbance.  Respiratory: Positive for cough. Negative for choking, shortness of breath and wheezing.   Cardiovascular: Negative for chest pain, palpitations and leg swelling.  Gastrointestinal: Negative for vomiting, abdominal pain, diarrhea,  constipation and blood in stool.  Endocrine: Negative for cold intolerance, heat intolerance and polydipsia.  Genitourinary: Negative for dysuria, hematuria and decreased urine volume.  Musculoskeletal: Negative for back pain, arthralgias and gait problem.  Skin: Negative for rash.  Allergic/Immunologic: Positive for environmental allergies.  Neurological: Negative for seizures, syncope, light-headedness and headaches.  Hematological: Negative for adenopathy.  Psychiatric/Behavioral: Positive for dysphoric mood. Negative for suicidal ideas and agitation. The patient is not nervous/anxious.     Per HPI unless specifically indicated above     Objective:    BP 160/86 mmHg  Pulse 90  Temp(Src) 99.9 F (37.7 C)  Ht 5' 4"  (1.626 m)  Wt 170 lb 8 oz (77.338 kg)  BMI 29.25 kg/m2  SpO2 98%  Wt Readings from Last 3 Encounters:  06/07/15 170 lb 8 oz (77.338 kg)  04/26/15 173 lb 14.4 oz (78.881 kg)  04/11/15 173 lb 3.2 oz (78.563 kg)    Physical Exam  Constitutional: She is oriented to person, place, and time. She appears well-developed and well-nourished.  HENT:  Head: Normocephalic and atraumatic.  Neck: Neck supple.  Cardiovascular: Normal rate and regular rhythm.   Pulmonary/Chest: Effort normal and breath sounds normal.  Abdominal: Soft. Bowel sounds are normal. She exhibits no mass. There is no tenderness.  Musculoskeletal: She exhibits no edema.  Lymphadenopathy:    She has no cervical adenopathy.  Neurological: She is alert and oriented to person, place, and time.  Skin: Skin is warm  and dry.  Psychiatric: She has a normal mood and affect. Her behavior is normal.  Vitals reviewed.   Results for orders placed or performed in visit on 05/19/15  COMPLETE METABOLIC PANEL WITH GFR  Result Value Ref Range   Sodium 138 135 - 146 mmol/L   Potassium 4.1 3.5 - 5.3 mmol/L   Chloride 103 98 - 110 mmol/L   CO2 25 20 - 31 mmol/L   Glucose, Bld 84 65 - 99 mg/dL   BUN 10 7 - 25 mg/dL    Creat 0.83 0.50 - 1.10 mg/dL   Total Bilirubin 0.3 0.2 - 1.2 mg/dL   Alkaline Phosphatase 64 33 - 115 U/L   AST 20 10 - 35 U/L   ALT 14 6 - 29 U/L   Total Protein 6.8 6.1 - 8.1 g/dL   Albumin 4.2 3.6 - 5.1 g/dL   Calcium 9.0 8.6 - 10.2 mg/dL   GFR, Est African American >89 >=60 mL/min   GFR, Est Non African American 84 >=60 mL/min  CBC with Differential/Platelet  Result Value Ref Range   WBC 7.2 4.0 - 10.5 K/uL   RBC 5.44 (H) 3.87 - 5.11 MIL/uL   Hemoglobin 12.0 12.0 - 15.0 g/dL   HCT 38.7 36.0 - 46.0 %   MCV 71.1 (L) 78.0 - 100.0 fL   MCH 22.1 (L) 26.0 - 34.0 pg   MCHC 31.0 30.0 - 36.0 g/dL   RDW 16.3 (H) 11.5 - 15.5 %   Platelets 340 150 - 400 K/uL   MPV 10.5 8.6 - 12.4 fL   Neutrophils Relative % 57 43 - 77 %   Neutro Abs 4.1 1.7 - 7.7 K/uL   Lymphocytes Relative 34 12 - 46 %   Lymphs Abs 2.4 0.7 - 4.0 K/uL   Monocytes Relative 8 3 - 12 %   Monocytes Absolute 0.6 0.1 - 1.0 K/uL   Eosinophils Relative 1 0 - 5 %   Eosinophils Absolute 0.1 0.0 - 0.7 K/uL   Basophils Relative 0 0 - 1 %   Basophils Absolute 0.0 0.0 - 0.1 K/uL   Smear Review Criteria for review not met   Lipid panel  Result Value Ref Range   Cholesterol 178 125 - 200 mg/dL   Triglycerides 302 (H) <150 mg/dL   HDL 45 (L) >=46 mg/dL   Total CHOL/HDL Ratio 4.0 <=5.0 Ratio   VLDL 60 (H) <30 mg/dL   LDL Cholesterol 73 <130 mg/dL  TSH  Result Value Ref Range   TSH 2.738 0.350 - 4.500 uIU/mL  Hemoglobin A1c  Result Value Ref Range   Hgb A1c MFr Bld 5.8 (H) <5.7 %   Mean Plasma Glucose 120 (H) <117 mg/dL      Assessment & Plan:   Encounter Diagnoses  Name Primary?  . Essential hypertension, benign Yes  . Hypertriglyceridemia   . Cigarette nicotine dependence, uncomplicated     -reviewed labs with pt -Increase benazepril -couseled on smoking cessation -Lowfat diet for triglicerides -F/u 6 wk to recheck bp

## 2015-06-07 NOTE — Patient Instructions (Signed)

## 2015-07-19 ENCOUNTER — Encounter: Payer: Self-pay | Admitting: Physician Assistant

## 2015-07-19 ENCOUNTER — Ambulatory Visit: Payer: Self-pay | Admitting: Physician Assistant

## 2015-07-19 VITALS — BP 158/86 | HR 82 | Temp 98.1°F | Ht 64.0 in | Wt 170.8 lb

## 2015-07-19 DIAGNOSIS — I10 Essential (primary) hypertension: Secondary | ICD-10-CM

## 2015-07-19 DIAGNOSIS — F419 Anxiety disorder, unspecified: Secondary | ICD-10-CM

## 2015-07-19 MED ORDER — METOPROLOL TARTRATE 50 MG PO TABS: 50.0000 mg | ORAL_TABLET | Freq: Two times a day (BID) | ORAL | Status: DC

## 2015-07-19 MED ORDER — CITALOPRAM HYDROBROMIDE 20 MG PO TABS: 20.0000 mg | ORAL_TABLET | Freq: Every day | ORAL | Status: DC

## 2015-07-19 NOTE — Progress Notes (Signed)
BP 158/86 mmHg  Pulse 82  Temp(Src) 98.1 F (36.7 C)  Ht  (1.626 m)  Wt 170 lb 12.8 oz (77.474 kg)  BMI 29.30 kg/m2  SpO2 99%   Subjective:    Patient ID: Claire Fowler, female    DOB: 04-22-1966, 50 y.o.   MRN: [id number]  HPI: Claire Fowler is a 50 y.o. female presenting on 07/19/2015 for Hypertension   HPI Chief Complaint  Patient presents with  . Hypertension    stressed due to alcoholic brother has been living in home with her and he is not working    Relevant past medical, surgical, family and social history reviewed and updated as indicated. Interim medical history since our last visit reviewed. Allergies and medications reviewed and updated.   Current outpatient prescriptions:  .  benazepril (LOTENSIN) 40 MG tablet, Take 1 tablet (40 mg total) by mouth daily., Disp: 30 tablet, Rfl: 3 .  diphenhydrAMINE (BENADRYL) 25 MG tablet, Take 50 mg by mouth at bedtime., Disp: , Rfl:  .  ferrous fumarate (HEMOCYTE - 106 MG FE) 325 (106 FE) MG TABS tablet, Take 1 tablet by mouth., Disp: , Rfl:  .  Fexofenadine HCl (MUCINEX ALLERGY PO), Take by mouth daily as needed., Disp: , Rfl:  .  ibuprofen (ADVIL,MOTRIN) 200 MG tablet, Take 800 mg by mouth daily as needed for mild pain or moderate pain., Disp: , Rfl:  .  loratadine (CLARITIN) 10 MG tablet, Take 10 mg by mouth daily., Disp: , Rfl:    Review of Systems  Constitutional: Positive for appetite change and unexpected weight change. Negative for fever, chills, diaphoresis and fatigue.  HENT: Positive for dental problem. Negative for congestion, drooling, ear pain, facial swelling, hearing loss, mouth sores, sneezing, sore throat, trouble swallowing and voice change.   Eyes: Negative for pain, discharge, redness, itching and visual disturbance.  Respiratory: Negative for cough, choking, shortness of breath and wheezing.   Cardiovascular: Negative for chest pain, palpitations and leg swelling.  Gastrointestinal: Negative for  vomiting, abdominal pain, diarrhea, constipation and blood in stool.  Endocrine: Negative for cold intolerance, heat intolerance and polydipsia.  Genitourinary: Negative for dysuria, hematuria and decreased urine volume.  Musculoskeletal: Negative for back pain, arthralgias and gait problem.  Skin: Negative for rash.  Allergic/Immunologic: Negative for environmental allergies.  Neurological: Positive for headaches. Negative for seizures, syncope and light-headedness.  Hematological: Negative for adenopathy.  Psychiatric/Behavioral: Negative for suicidal ideas, dysphoric mood and agitation. The patient is not nervous/anxious.     Per HPI unless specifically indicated above     Objective:    BP 158/86 mmHg  Pulse 82  Temp(Src) 98.1 F (36.7 C)  Ht  (1.626 m)  Wt 170 lb 12.8 oz (77.474 kg)  BMI 29.30 kg/m2  SpO2 99%  Wt Readings from Last 3 Encounters:  07/19/15 170 lb 12.8 oz (77.474 kg)  06/07/15 170 lb 8 oz (77.338 kg)  04/26/15 173 lb 14.4 oz (78.881 kg)    Physical Exam  Constitutional: She is oriented to person, place, and time. She appears well-developed and well-nourished.  HENT:  Head: Normocephalic and atraumatic.  Neck: Neck supple.  Cardiovascular: Normal rate and regular rhythm.   Pulmonary/Chest: Effort normal and breath sounds normal.  Abdominal: Soft. Bowel sounds are normal. She exhibits no mass. There is no tenderness.  Musculoskeletal: She exhibits no edema.  Lymphadenopathy:    She has no cervical adenopathy.  Neurological: She is alert and oriented to person, place,  and time.  Skin: Skin is warm and dry.  Psychiatric: She has a normal mood and affect. Her behavior is normal.  Vitals reviewed.       Assessment & Plan:    Encounter Diagnoses  Name Primary?  . Essential hypertension Yes  . Anxiety      -Start citalopram for anxiety  -Add metoprolol for bp -encouraged pt to get her brother out of her house in light of the problems it is  causing her -F/u 3 wk.  rto sooner prn

## 2015-08-08 ENCOUNTER — Ambulatory Visit: Payer: Self-pay | Admitting: Physician Assistant

## 2015-08-10 ENCOUNTER — Encounter: Payer: Self-pay | Admitting: Physician Assistant

## 2015-09-12 ENCOUNTER — Ambulatory Visit: Payer: Self-pay | Admitting: Physician Assistant

## 2015-09-14 ENCOUNTER — Ambulatory Visit: Payer: Self-pay | Admitting: Physician Assistant

## 2015-09-14 ENCOUNTER — Encounter: Payer: Self-pay | Admitting: Physician Assistant

## 2015-09-14 VITALS — BP 166/92 | HR 95 | Temp 97.5°F | Ht 64.0 in | Wt 168.2 lb

## 2015-09-14 DIAGNOSIS — F17210 Nicotine dependence, cigarettes, uncomplicated: Secondary | ICD-10-CM

## 2015-09-14 DIAGNOSIS — H6122 Impacted cerumen, left ear: Secondary | ICD-10-CM

## 2015-09-14 DIAGNOSIS — F411 Generalized anxiety disorder: Secondary | ICD-10-CM

## 2015-09-14 DIAGNOSIS — I10 Essential (primary) hypertension: Secondary | ICD-10-CM

## 2015-09-14 MED ORDER — CITALOPRAM HYDROBROMIDE 20 MG PO TABS: 20.0000 mg | ORAL_TABLET | Freq: Every day | ORAL | Status: DC

## 2015-09-14 NOTE — Patient Instructions (Signed)
take metoprolol TWICE daily Restart citalopram

## 2015-09-14 NOTE — Progress Notes (Signed)
BP 166/92 mmHg  Pulse 95  Temp(Src) 97.5 F (36.4 C)  Ht  (1.626 m)  Wt 168 lb 3.2 oz (76.295 kg)  BMI 28.86 kg/m2  SpO2 99%   Subjective:    Patient ID: Claire Fowler, female    DOB: 1965/09/24, 50 y.o.   MRN: [id number]  HPI: Claire Fowler is a 50 y.o. female presenting on 09/14/2015 for Hypertension and Anxiety   HPI Pt says citalopram worked well but she ran out (she no-showed to her follow-up appointment and then canceled an appointment after that)  Relevant past medical, surgical, family and social history reviewed and updated as indicated. Interim medical history since our last visit reviewed. Allergies and medications reviewed and updated.  Current outpatient prescriptions:  .  benazepril (LOTENSIN) 40 MG tablet, Take 1 tablet (40 mg total) by mouth daily., Disp: 30 tablet, Rfl: 3 .  diphenhydrAMINE (BENADRYL) 25 MG tablet, Take 50 mg by mouth at bedtime., Disp: , Rfl:  .  ferrous fumarate (HEMOCYTE - 106 MG FE) 325 (106 FE) MG TABS tablet, Take 1 tablet by mouth., Disp: , Rfl:  .  Fexofenadine HCl (MUCINEX ALLERGY PO), Take by mouth daily as needed., Disp: , Rfl:  .  ibuprofen (ADVIL,MOTRIN) 200 MG tablet, Take 800 mg by mouth daily as needed for mild pain or moderate pain., Disp: , Rfl:  .  loratadine (CLARITIN) 10 MG tablet, Take 10 mg by mouth daily., Disp: , Rfl:  .  metoprolol (LOPRESSOR) 50 MG tablet, Take 1 tablet (50 mg total) by mouth 2 (two) times daily. (Patient taking differently: Take 50 mg by mouth daily. ), Disp: 60 tablet, Rfl: 1 .  citalopram (CELEXA) 20 MG tablet, Take 1 tablet (20 mg total) by mouth daily. (Patient not taking: Reported on 09/14/2015), Disp: 30 tablet, Rfl: 0   Review of Systems  Constitutional: Positive for appetite change. Negative for fever, chills, diaphoresis, fatigue and unexpected weight change.  HENT: Positive for congestion, dental problem, ear pain (right) and sore throat. Negative for drooling, facial swelling, hearing  loss, mouth sores, sneezing, trouble swallowing and voice change.   Eyes: Negative for pain, discharge, redness, itching and visual disturbance.  Respiratory: Positive for cough, shortness of breath and wheezing. Negative for choking.   Cardiovascular: Negative for chest pain, palpitations and leg swelling.  Gastrointestinal: Negative for vomiting, abdominal pain, diarrhea, constipation and blood in stool.  Endocrine: Negative for cold intolerance, heat intolerance and polydipsia.  Genitourinary: Negative for dysuria, hematuria and decreased urine volume.  Musculoskeletal: Positive for back pain. Negative for arthralgias and gait problem.  Skin: Negative for rash.  Allergic/Immunologic: Negative for environmental allergies.  Neurological: Negative for seizures, syncope, light-headedness and headaches.  Hematological: Negative for adenopathy.  Psychiatric/Behavioral: Positive for dysphoric mood. Negative for suicidal ideas and agitation. The patient is not nervous/anxious.     Per HPI unless specifically indicated above     Objective:    BP 166/92 mmHg  Pulse 95  Temp(Src) 97.5 F (36.4 C)  Ht  (1.626 m)  Wt 168 lb 3.2 oz (76.295 kg)  BMI 28.86 kg/m2  SpO2 99%  Wt Readings from Last 3 Encounters:  09/14/15 168 lb 3.2 oz (76.295 kg)  07/19/15 170 lb 12.8 oz (77.474 kg)  06/07/15 170 lb 8 oz (77.338 kg)    Physical Exam  Constitutional: She is oriented to person, place, and time. She appears well-developed and well-nourished.  HENT:  Head: Normocephalic and atraumatic.  Right Ear:  Hearing, tympanic membrane, external ear and ear canal normal.  Left Ear: Hearing, tympanic membrane and external ear normal. A foreign body (cerumen) is present.  Nose: Nose normal.  Mouth/Throat: Uvula is midline and oropharynx is clear and moist. No oropharyngeal exudate.  Neck: Neck supple.  Cardiovascular: Normal rate and regular rhythm.   Pulmonary/Chest: Effort normal and breath sounds  normal. She has no wheezes.  Abdominal: Soft. Bowel sounds are normal. She exhibits no mass. There is no hepatosplenomegaly. There is no tenderness.  Musculoskeletal: She exhibits no edema.  Lymphadenopathy:    She has no cervical adenopathy.  Neurological: She is alert and oriented to person, place, and time.  Skin: Skin is warm and dry.  Psychiatric: She has a normal mood and affect. Her behavior is normal.  Vitals reviewed.     Assessment & Plan:   Encounter Diagnoses  Name Primary?  . Essential hypertension Yes  . Anxiety state   . Cigarette nicotine dependence, uncomplicated   . Cerumen impaction, left     -Take metoprolol bid as prescribed. -Restart citalopram -pt declined ear lavage today.  Will do in the future if she desires -Counseled on smoking cessation -F/u 3 wk = recheck bp and citalopram

## 2015-09-17 DIAGNOSIS — F411 Generalized anxiety disorder: Secondary | ICD-10-CM | POA: Insufficient documentation

## 2015-09-19 ENCOUNTER — Other Ambulatory Visit: Payer: Self-pay | Admitting: Physician Assistant

## 2015-09-19 MED ORDER — BENAZEPRIL HCL 40 MG PO TABS: 40.0000 mg | ORAL_TABLET | Freq: Every day | ORAL | Status: DC

## 2015-09-21 ENCOUNTER — Emergency Department (HOSPITAL_COMMUNITY): Payer: No Typology Code available for payment source

## 2015-09-21 ENCOUNTER — Emergency Department (HOSPITAL_COMMUNITY)
Admission: EM | Admit: 2015-09-21 | Discharge: 2015-09-21 | Disposition: A | Discharge status: Home / Self Care | Payer: No Typology Code available for payment source | Attending: Emergency Medicine | Admitting: Emergency Medicine

## 2015-09-21 ENCOUNTER — Encounter (HOSPITAL_COMMUNITY): Payer: Self-pay | Admitting: Emergency Medicine

## 2015-09-21 DIAGNOSIS — M546 Pain in thoracic spine: Secondary | ICD-10-CM | POA: Insufficient documentation

## 2015-09-21 DIAGNOSIS — M542 Cervicalgia: Secondary | ICD-10-CM | POA: Diagnosis not present

## 2015-09-21 DIAGNOSIS — S0990XA Unspecified injury of head, initial encounter: Secondary | ICD-10-CM | POA: Insufficient documentation

## 2015-09-21 DIAGNOSIS — M545 Low back pain: Secondary | ICD-10-CM | POA: Diagnosis not present

## 2015-09-21 DIAGNOSIS — F17210 Nicotine dependence, cigarettes, uncomplicated: Secondary | ICD-10-CM | POA: Diagnosis not present

## 2015-09-21 DIAGNOSIS — Y929 Unspecified place or not applicable: Secondary | ICD-10-CM | POA: Diagnosis not present

## 2015-09-21 DIAGNOSIS — Y939 Activity, unspecified: Secondary | ICD-10-CM | POA: Diagnosis not present

## 2015-09-21 DIAGNOSIS — Y999 Unspecified external cause status: Secondary | ICD-10-CM | POA: Diagnosis not present

## 2015-09-21 DIAGNOSIS — I10 Essential (primary) hypertension: Secondary | ICD-10-CM

## 2015-09-21 MED ORDER — ACETAMINOPHEN 500 MG PO TABS
1000.0000 mg | ORAL_TABLET | Freq: Once | ORAL | Status: AC
  Administered 2015-09-21: 1000 mg via ORAL
  Filled 2015-09-21: qty 2
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
## 2015-09-23 ENCOUNTER — Emergency Department (HOSPITAL_COMMUNITY)
Admission: EM | Admit: 2015-09-23 | Discharge: 2015-09-23 | Disposition: A | Discharge status: Home / Self Care | Payer: No Typology Code available for payment source

## 2015-09-24 ENCOUNTER — Emergency Department (HOSPITAL_COMMUNITY)
Admission: EM | Admit: 2015-09-24 | Discharge: 2015-09-24 | Disposition: A | Discharge status: Home / Self Care | Payer: No Typology Code available for payment source | Attending: Emergency Medicine | Admitting: Emergency Medicine

## 2015-09-24 ENCOUNTER — Encounter (HOSPITAL_COMMUNITY): Payer: Self-pay | Admitting: Emergency Medicine

## 2015-09-24 ENCOUNTER — Emergency Department (HOSPITAL_COMMUNITY): Payer: No Typology Code available for payment source

## 2015-09-24 DIAGNOSIS — R0789 Other chest pain: Secondary | ICD-10-CM | POA: Insufficient documentation

## 2015-09-24 DIAGNOSIS — I10 Essential (primary) hypertension: Secondary | ICD-10-CM | POA: Diagnosis not present

## 2015-09-24 DIAGNOSIS — Z79899 Other long term (current) drug therapy: Secondary | ICD-10-CM | POA: Diagnosis not present

## 2015-09-24 DIAGNOSIS — Z87891 Personal history of nicotine dependence: Secondary | ICD-10-CM | POA: Diagnosis not present

## 2015-09-24 LAB — POC URINE PREG, ED: Preg Test, Ur: NEGATIVE

## 2015-09-24 MED ORDER — IBUPROFEN 600 MG PO TABS: 600.0000 mg | ORAL_TABLET | Freq: Three times a day (TID) | ORAL | Status: DC | PRN

## 2015-09-24 MED ORDER — TRAMADOL HCL 50 MG PO TABS: 50.0000 mg | ORAL_TABLET | Freq: Four times a day (QID) | ORAL | Status: DC | PRN

## 2015-09-24 NOTE — Discharge Instructions (Signed)

## 2015-09-24 NOTE — ED Notes (Signed)
Pt reports pain right chest and neck.Difficulty with ROM in right arm and neck is stiff
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
## 2015-09-30 ENCOUNTER — Emergency Department (HOSPITAL_COMMUNITY)
Admission: EM | Admit: 2015-09-30 | Discharge: 2015-09-30 | Disposition: A | Discharge status: Home / Self Care | Payer: No Typology Code available for payment source | Attending: Emergency Medicine | Admitting: Emergency Medicine

## 2015-09-30 ENCOUNTER — Emergency Department (HOSPITAL_COMMUNITY): Payer: No Typology Code available for payment source

## 2015-09-30 ENCOUNTER — Encounter (HOSPITAL_COMMUNITY): Payer: Self-pay | Admitting: Emergency Medicine

## 2015-09-30 DIAGNOSIS — I10 Essential (primary) hypertension: Secondary | ICD-10-CM | POA: Diagnosis not present

## 2015-09-30 DIAGNOSIS — J302 Other seasonal allergic rhinitis: Secondary | ICD-10-CM | POA: Diagnosis not present

## 2015-09-30 DIAGNOSIS — R0789 Other chest pain: Secondary | ICD-10-CM | POA: Insufficient documentation

## 2015-09-30 DIAGNOSIS — Z87891 Personal history of nicotine dependence: Secondary | ICD-10-CM | POA: Diagnosis not present

## 2015-09-30 MED ORDER — DEXAMETHASONE SODIUM PHOSPHATE 4 MG/ML IJ SOLN
12.0000 mg | Freq: Once | INTRAMUSCULAR | Status: AC
  Administered 2015-09-30: 12 mg via INTRAMUSCULAR
  Filled 2015-09-30: qty 3

## 2015-09-30 MED ORDER — GUAIFENESIN-CODEINE 100-10 MG/5ML PO SOLN: 10.0000 mL | Freq: Four times a day (QID) | ORAL | Status: DC | PRN

## 2015-09-30 MED ORDER — GUAIFENESIN-CODEINE 100-10 MG/5ML PO SOLN
10.0000 mL | Freq: Once | ORAL | Status: AC
  Administered 2015-09-30: 10 mL via ORAL
  Filled 2015-09-30: qty 10

## 2015-09-30 NOTE — Discharge Instructions (Signed)
Allergic Rhinitis Allergic rhinitis is when the mucous membranes in the nose respond to allergens. Allergens are particles in the air that cause your body to have an allergic reaction. This causes you to release allergic antibodies. Through a chain of events, these eventually cause you to release histamine into the blood stream. Although meant to protect the body, it is this release of histamine that causes your discomfort, such as frequent sneezing, congestion, and an itchy, runny nose.  CAUSES Seasonal allergic rhinitis (hay fever) is caused by pollen allergens that may come from grasses, trees, and weeds. Year-round allergic rhinitis (perennial allergic rhinitis) is caused by allergens such as house dust mites, pet dander, and mold spores. SYMPTOMS  Nasal stuffiness (congestion).  Itchy, runny nose with sneezing and tearing of the eyes. DIAGNOSIS Your health care provider can help you determine the allergen or allergens that trigger your symptoms. If you and your health care provider are unable to determine the allergen, skin or blood testing may be used. Your health care provider will diagnose your condition after taking your health history and performing a physical exam. Your health care provider may assess you for other related conditions, such as asthma, pink eye, or an ear infection. TREATMENT Allergic rhinitis does not have a cure, but it can be controlled by:  Medicines that block allergy symptoms. These may include allergy shots, nasal sprays, and oral antihistamines.  Avoiding the allergen. Hay fever may often be treated with antihistamines in pill or nasal spray forms. Antihistamines block the effects of histamine. There are over-the-counter medicines that may help with nasal congestion and swelling around the eyes. Check with your health care provider before taking or giving this medicine. If avoiding the allergen or the medicine prescribed do not work, there are many new medicines  your health care provider can prescribe. Stronger medicine may be used if initial measures are ineffective. Desensitizing injections can be used if medicine and avoidance does not work. Desensitization is when a patient is given ongoing shots until the body becomes less sensitive to the allergen. Make sure you follow up with your health care provider if problems continue. HOME CARE INSTRUCTIONS It is not possible to completely avoid allergens, but you can reduce your symptoms by taking steps to limit your exposure to them. It helps to know exactly what you are allergic to so that you can avoid your specific triggers. SEEK MEDICAL CARE IF:  You have a fever.  You develop a cough that does not stop easily (persistent).  You have shortness of breath.  You start wheezing.  Symptoms interfere with normal daily activities.   This information is not intended to replace advice given to you by your health care provider. Make sure you discuss any questions you have with your health care provider.   Document Released: 03/13/2001 Document Revised: 07/09/2014 Document Reviewed: 02/23/2013 Elsevier Interactive Patient Education 2016 [employer]  Chest Wall Pain Chest wall pain is pain in or around the bones and muscles of your chest. Sometimes, an injury causes this pain. Sometimes, the cause may not be known. This pain may take several weeks or longer to get better. HOME CARE INSTRUCTIONS  Pay attention to any changes in your symptoms. Take these actions to help with your pain:   Rest as told by your health care provider.   Avoid activities that cause pain. These include any activities that use your chest muscles or your abdominal and side muscles to lift heavy items.   If directed,  apply ice to the painful area:  Put ice in a plastic bag.  Place a towel between your skin and the bag.  Leave the ice on for 20 minutes, 2-3 times per day.  Take over-the-counter and prescription medicines  only as told by your health care provider.  Do not use tobacco products, including cigarettes, chewing tobacco, and e-cigarettes. If you need help quitting, ask your health care provider.  Keep all follow-up visits as told by your health care provider. This is important. SEEK MEDICAL CARE IF:  You have a fever.  Your chest pain becomes worse.  You have new symptoms. SEEK IMMEDIATE MEDICAL CARE IF:  You have nausea or vomiting.  You feel sweaty or light-headed.  You have a cough with phlegm (sputum) or you cough up blood.  You develop shortness of breath.   This information is not intended to replace advice given to you by your health care provider. Make sure you discuss any questions you have with your health care provider.   Document Released: 06/18/2005 Document Revised: 03/09/2015 Document Reviewed: 09/13/2014 Elsevier Interactive Patient Education 2016 [employer].   You may use the cough syrup prescribed.  This may also help with your chest wall pain.    This will make you drowsy - do not drive within 4 hours of taking this medication.  Plan to see your primary doctor if your symptoms are not improved in 1 week.  Continue taking your allergy medication.
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
## 2015-10-05 ENCOUNTER — Encounter: Payer: Self-pay | Admitting: Physician Assistant

## 2015-10-05 ENCOUNTER — Ambulatory Visit: Payer: Self-pay | Admitting: Physician Assistant

## 2015-10-05 VITALS — BP 180/96 | HR 85 | Temp 97.9°F | Ht 64.0 in | Wt 169.0 lb

## 2015-10-05 DIAGNOSIS — F419 Anxiety disorder, unspecified: Secondary | ICD-10-CM

## 2015-10-05 DIAGNOSIS — I10 Essential (primary) hypertension: Secondary | ICD-10-CM

## 2015-10-05 DIAGNOSIS — F17210 Nicotine dependence, cigarettes, uncomplicated: Secondary | ICD-10-CM

## 2015-10-05 MED ORDER — METOPROLOL TARTRATE 100 MG PO TABS: 100.0000 mg | ORAL_TABLET | Freq: Two times a day (BID) | ORAL | Status: DC

## 2015-10-05 NOTE — Progress Notes (Signed)
BP 180/96 mmHg  Pulse 85  Temp(Src) 97.9 F (36.6 C)  Ht  (1.626 m)  Wt 169 lb (76.658 kg)  BMI 28.99 kg/m2  SpO2 98%  LMP 07/26/2015   Subjective:    Patient ID: Claire Fowler, female    DOB: [birth year], 50 y.o.   MRN: [id number]  HPI: Claire Fowler is a 50 y.o. female presenting on 10/05/2015 for Hypertension and Anxiety   HPI   Pt has not been taking her medications correctly.  She has not been taking her metoprolol at all and has been taking the benazepril bid.  She again did not bring her medications with her to her appointment.  Her allergies are better.  She says the citalopram is helping her anxiety a lot.   Relevant past medical, surgical, family and social history reviewed and updated as indicated. Interim medical history since our last visit reviewed. Allergies and medications reviewed and updated.   Current outpatient prescriptions:  .  benazepril (LOTENSIN) 40 MG tablet, Take 1 tablet (40 mg total) by mouth daily. (Patient taking differently: Take 40 mg by mouth 2 (two) times daily. ), Disp: 30 tablet, Rfl: 3 .  citalopram (CELEXA) 20 MG tablet, Take 1 tablet (20 mg total) by mouth daily., Disp: 30 tablet, Rfl: 0 .  diphenhydrAMINE (BENADRYL) 25 MG tablet, Take 50 mg by mouth at bedtime., Disp: , Rfl:  .  ferrous fumarate (HEMOCYTE - 106 MG FE) 325 (106 FE) MG TABS tablet, Take 1 tablet by mouth daily. , Disp: , Rfl:  .  Fexofenadine HCl (MUCINEX ALLERGY PO), Take 1 tablet by mouth daily as needed (allergies). , Disp: , Rfl:  .  guaiFENesin-codeine 100-10 MG/5ML syrup, Take 10 mLs by mouth every 6 (six) hours as needed for cough., Disp: 120 mL, Rfl: 0 .  ibuprofen (ADVIL,MOTRIN) 600 MG tablet, Take 1 tablet (600 mg total) by mouth every 8 (eight) hours as needed., Disp: 30 tablet, Rfl: 0 .  loratadine (CLARITIN) 10 MG tablet, Take 10 mg by mouth daily. Reported on 10/05/2015, Disp: , Rfl:  .  traMADol (ULTRAM) 50 MG tablet, Take 1 tablet (50 mg total) by  mouth every 6 (six) hours as needed., Disp: 12 tablet, Rfl: 0 .  metoprolol (LOPRESSOR) 50 MG tablet, Take 1 tablet (50 mg total) by mouth 2 (two) times daily. (Patient not taking: Reported on 10/05/2015), Disp: 60 tablet, Rfl: 1   Review of Systems  Constitutional: Positive for appetite change and fatigue. Negative for fever and chills.  HENT: Positive for congestion and dental problem. Negative for drooling, facial swelling, hearing loss, mouth sores, sneezing, trouble swallowing and voice change.   Eyes: Negative for pain, discharge, redness, itching and visual disturbance.  Respiratory: Positive for cough, chest tightness, shortness of breath and wheezing.   Cardiovascular: Negative for palpitations and leg swelling.  Gastrointestinal: Negative for vomiting, abdominal pain, constipation and blood in stool.  Endocrine: Negative for cold intolerance, heat intolerance and polydipsia.  Genitourinary: Negative for dysuria and hematuria.  Musculoskeletal: Negative for back pain, arthralgias and gait problem.  Skin: Negative for rash.  Allergic/Immunologic: Negative for environmental allergies.  Neurological: Negative for syncope, light-headedness and headaches.  Hematological: Negative for adenopathy.  Psychiatric/Behavioral: Positive for dysphoric mood. Negative for suicidal ideas and agitation. The patient is nervous/anxious.     Per HPI unless specifically indicated above     Objective:    BP 180/96 mmHg  Pulse 85  Temp(Src) 97.9 F (36.6 C)  Ht 5\' 4"  (1.626 m)  Wt 169 lb (76.658 kg)  BMI 28.99 kg/m2  SpO2 98%  LMP 07/26/2015  Wt Readings from Last 3 Encounters:  10/05/15 169 lb (76.658 kg)  09/24/15 160 lb (72.576 kg)  09/21/15 160 lb (72.576 kg)    Physical Exam  Constitutional: She is oriented to person, place, and time. She appears well-developed and well-nourished.  HENT:  Head: Normocephalic and atraumatic.  Neck: Neck supple.  Cardiovascular: Normal rate and regular  rhythm.   Pulmonary/Chest: Effort normal and breath sounds normal.  Abdominal: Soft. Bowel sounds are normal. She exhibits no mass. There is no hepatosplenomegaly. There is no tenderness.  Musculoskeletal: She exhibits no edema.  Lymphadenopathy:    She has no cervical adenopathy.  Neurological: She is alert and oriented to person, place, and time.  Skin: Skin is warm and dry.  Psychiatric: She has a normal mood and affect. Her behavior is normal.  Vitals reviewed.       Assessment & Plan:   Encounter Diagnoses  Name Primary?  . Essential hypertension Yes  . Anxiety   . Cigarette nicotine dependence, uncomplicated      -BRING ALL MEDS TO EVERY APPOINTMENT! -Take benazepril once daily .  Increase metoprolol to 100mg  and take it twice daily. -Avoid otc decongestants -F/u 1 month to recheck bp

## 2015-10-05 NOTE — Patient Instructions (Signed)
BRING ALL MEDS TO EVERY APPOINTMENT! Take benazepril once daily .  Increase metoprolol to 100mg  and take it twice daily.

## 2015-10-20 ENCOUNTER — Emergency Department (HOSPITAL_COMMUNITY): Payer: No Typology Code available for payment source

## 2015-10-20 ENCOUNTER — Encounter (HOSPITAL_COMMUNITY): Payer: Self-pay

## 2015-10-20 ENCOUNTER — Emergency Department (HOSPITAL_COMMUNITY)
Admission: EM | Admit: 2015-10-20 | Discharge: 2015-10-20 | Disposition: A | Discharge status: Home / Self Care | Payer: No Typology Code available for payment source | Attending: Emergency Medicine | Admitting: Emergency Medicine

## 2015-10-20 DIAGNOSIS — F17210 Nicotine dependence, cigarettes, uncomplicated: Secondary | ICD-10-CM | POA: Insufficient documentation

## 2015-10-20 DIAGNOSIS — M545 Low back pain: Secondary | ICD-10-CM | POA: Diagnosis not present

## 2015-10-20 DIAGNOSIS — M549 Dorsalgia, unspecified: Secondary | ICD-10-CM

## 2015-10-20 DIAGNOSIS — M542 Cervicalgia: Secondary | ICD-10-CM | POA: Diagnosis present

## 2015-10-20 DIAGNOSIS — R05 Cough: Secondary | ICD-10-CM | POA: Insufficient documentation

## 2015-10-20 DIAGNOSIS — R030 Elevated blood-pressure reading, without diagnosis of hypertension: Secondary | ICD-10-CM | POA: Insufficient documentation

## 2015-10-20 DIAGNOSIS — I10 Essential (primary) hypertension: Secondary | ICD-10-CM | POA: Insufficient documentation

## 2015-10-20 DIAGNOSIS — G8929 Other chronic pain: Secondary | ICD-10-CM | POA: Insufficient documentation

## 2015-10-20 HISTORY — DX: Low back pain: M54.5

## 2015-10-20 HISTORY — DX: Cervicalgia: M54.2

## 2015-10-20 HISTORY — DX: Low back pain, unspecified: M54.50

## 2015-10-20 MED ORDER — TRAMADOL HCL 50 MG PO TABS: 50.0000 mg | ORAL_TABLET | Freq: Four times a day (QID) | ORAL | Status: DC | PRN

## 2015-10-20 MED ORDER — METOPROLOL TARTRATE 50 MG PO TABS: 100.0000 mg | ORAL_TABLET | Freq: Once | ORAL | Status: DC

## 2015-10-20 MED ORDER — METHOCARBAMOL 500 MG PO TABS: 1000.0000 mg | ORAL_TABLET | Freq: Four times a day (QID) | ORAL | Status: DC | PRN

## 2015-10-20 MED ORDER — TRAMADOL HCL 50 MG PO TABS
50.0000 mg | ORAL_TABLET | Freq: Once | ORAL | Status: AC
  Administered 2015-10-20: 50 mg via ORAL
  Filled 2015-10-20: qty 1

## 2015-10-20 NOTE — ED Notes (Signed)
Patient given discharge instruction, verbalized understand. Patient ambulatory out of the department.  

## 2015-10-20 NOTE — Discharge Instructions (Signed)
Take the prescriptions as directed.  Apply moist heat or ice to the area(s) of discomfort, for 15 minutes at a time, several times per day for the next few days.  Do not fall asleep on a heating or ice pack.  Call your regular medical doctor tomorrow to schedule a follow up appointment in the next 2 days.  Return to the Emergency Department immediately if worsening. ° °

## 2015-10-20 NOTE — ED Notes (Signed)
Pt has multiple complaints, neck and back pain, cough, BP elevated, pt states she has been taking her medication, feels like BP is hight due to pain
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
## 2015-11-07 ENCOUNTER — Encounter: Payer: Self-pay | Admitting: Physician Assistant

## 2015-11-07 ENCOUNTER — Ambulatory Visit: Payer: Self-pay | Admitting: Physician Assistant

## 2015-11-07 VITALS — BP 130/78 | HR 77 | Temp 97.7°F | Ht 64.0 in | Wt 172.6 lb

## 2015-11-07 DIAGNOSIS — E785 Hyperlipidemia, unspecified: Secondary | ICD-10-CM | POA: Insufficient documentation

## 2015-11-07 DIAGNOSIS — F17210 Nicotine dependence, cigarettes, uncomplicated: Secondary | ICD-10-CM

## 2015-11-07 DIAGNOSIS — D649 Anemia, unspecified: Secondary | ICD-10-CM

## 2015-11-07 DIAGNOSIS — Z1239 Encounter for other screening for malignant neoplasm of breast: Secondary | ICD-10-CM

## 2015-11-07 DIAGNOSIS — I10 Essential (primary) hypertension: Secondary | ICD-10-CM

## 2015-11-07 DIAGNOSIS — F419 Anxiety disorder, unspecified: Secondary | ICD-10-CM

## 2015-11-07 NOTE — Progress Notes (Signed)
BP 130/78 mmHg  Pulse 77  Temp(Src) 97.7 F (36.5 C)  Ht  (1.626 m)  Wt 172 lb 9.6 oz (78.291 kg)  BMI 29.61 kg/m2  SpO2 98%  LMP 10/20/2015   Subjective:    Patient ID: Claire Fowler, female    DOB: 1965/11/17, 50 y.o.   MRN: [id number]  HPI: ANDREA FERRER is a 50 y.o. female presenting on 11/07/2015 for Hypertension   HPI   Pt STILL did not bring in her meds with her today despite requests for her to do this at every appointment.  Pt is no longer taking otc decongestants  Pt states a lot of anxiety and stress still.  Relevant past medical, surgical, family and social history reviewed and updated as indicated. Interim medical history since our last visit reviewed. Allergies and medications reviewed and updated.   Current outpatient prescriptions:  .  benazepril (LOTENSIN) 40 MG tablet, Take 1 tablet (40 mg total) by mouth daily. (Patient taking differently: Take 40 mg by mouth 2 (two) times daily. ), Disp: 30 tablet, Rfl: 3 .  citalopram (CELEXA) 20 MG tablet, Take 1 tablet (20 mg total) by mouth daily., Disp: 30 tablet, Rfl: 0 .  diphenhydrAMINE (BENADRYL) 25 MG tablet, Take 50 mg by mouth at bedtime., Disp: , Rfl:  .  ferrous fumarate (HEMOCYTE - 106 MG FE) 325 (106 FE) MG TABS tablet, Take 1 tablet by mouth daily. , Disp: , Rfl:  .  Fexofenadine HCl (MUCINEX ALLERGY PO), Take 1 tablet by mouth daily as needed (allergies). , Disp: , Rfl:  .  guaiFENesin-codeine 100-10 MG/5ML syrup, Take 10 mLs by mouth every 6 (six) hours as needed for cough., Disp: 120 mL, Rfl: 0 .  ibuprofen (ADVIL,MOTRIN) 600 MG tablet, Take 1 tablet (600 mg total) by mouth every 8 (eight) hours as needed., Disp: 30 tablet, Rfl: 0 .  loratadine (CLARITIN) 10 MG tablet, Take 10 mg by mouth daily as needed. Reported on 11/07/2015, Disp: , Rfl:  .  methocarbamol (ROBAXIN) 500 MG tablet, Take 2 tablets (1,000 mg total) by mouth 4 (four) times daily as needed for muscle spasms (muscle spasm/pain)., Disp:  25 tablet, Rfl: 0 .  metoprolol (LOPRESSOR) 100 MG tablet, Take 1 tablet (100 mg total) by mouth 2 (two) times daily., Disp: 180 tablet, Rfl: 3 .  traMADol (ULTRAM) 50 MG tablet, Take 1 tablet (50 mg total) by mouth every 6 (six) hours as needed for moderate pain or severe pain., Disp: 15 tablet, Rfl: 0    Review of Systems  Constitutional: Negative for fever, chills, diaphoresis, appetite change, fatigue and unexpected weight change.  HENT: Positive for congestion and dental problem. Negative for drooling, ear pain, facial swelling, hearing loss, mouth sores, sneezing, sore throat, trouble swallowing and voice change.   Eyes: Negative for pain, discharge, redness, itching and visual disturbance.  Respiratory: Positive for cough. Negative for choking, shortness of breath and wheezing.   Cardiovascular: Negative for chest pain, palpitations and leg swelling.  Gastrointestinal: Negative for vomiting, abdominal pain, diarrhea, constipation and blood in stool.  Endocrine: Negative for cold intolerance, heat intolerance and polydipsia.  Genitourinary: Negative for dysuria, hematuria and decreased urine volume.  Musculoskeletal: Positive for back pain. Negative for arthralgias and gait problem.  Skin: Negative for rash.  Allergic/Immunologic: Positive for environmental allergies.  Neurological: Positive for headaches. Negative for seizures, syncope and light-headedness.  Hematological: Negative for adenopathy.  Psychiatric/Behavioral: Positive for dysphoric mood. Negative for suicidal ideas and agitation.  The patient is not nervous/anxious.     Per HPI unless specifically indicated above     Objective:    BP 130/78 mmHg  Pulse 77  Temp(Src) 97.7 F (36.5 C)  Ht 5\' 4"  (1.626 m)  Wt 172 lb 9.6 oz (78.291 kg)  BMI 29.61 kg/m2  SpO2 98%  LMP 10/20/2015  Wt Readings from Last 3 Encounters:  11/07/15 172 lb 9.6 oz (78.291 kg)  10/20/15 170 lb (77.111 kg)  10/05/15 169 lb (76.658 kg)     Physical Exam  Constitutional: She is oriented to person, place, and time. She appears well-developed and well-nourished.  HENT:  Head: Normocephalic and atraumatic.  Neck: Neck supple.  Cardiovascular: Normal rate and regular rhythm.   Pulmonary/Chest: Effort normal and breath sounds normal.  Abdominal: Soft. Bowel sounds are normal. She exhibits no mass. There is no hepatosplenomegaly. There is no tenderness.  Musculoskeletal: She exhibits no edema.  Lymphadenopathy:    She has no cervical adenopathy.  Neurological: She is alert and oriented to person, place, and time.  Skin: Skin is warm and dry.  Psychiatric: She has a normal mood and affect. Her behavior is normal.  Vitals reviewed.       Assessment & Plan:    Encounter Diagnoses  Name Primary?  . Essential hypertension Yes  . Anxiety   . Screening for breast cancer   . Cigarette nicotine dependence, uncomplicated   . Anemia, unspecified anemia type   . Hyperlipidemia     -[facility]ave [facility] card for anxiety. Pt is given card for her to call to get appointment.  She is also told about their walk-in hours -order mammogram -Counseled on smoking cessation -F/u 3 months.  RTO sooner prn

## 2015-11-16 ENCOUNTER — Ambulatory Visit (HOSPITAL_COMMUNITY): Admission: RE | Admit: 2015-11-16 | Payer: PRIVATE HEALTH INSURANCE | Source: Ambulatory Visit

## 2015-11-16 ENCOUNTER — Ambulatory Visit (HOSPITAL_COMMUNITY)
Admission: RE | Admit: 2015-11-16 | Discharge: 2015-11-16 | Disposition: A | Discharge status: Home / Self Care | Payer: Self-pay | Source: Ambulatory Visit | Attending: Physician Assistant | Admitting: Physician Assistant

## 2015-11-16 ENCOUNTER — Other Ambulatory Visit: Payer: Self-pay | Admitting: Physician Assistant

## 2015-11-16 DIAGNOSIS — Z1231 Encounter for screening mammogram for malignant neoplasm of breast: Secondary | ICD-10-CM | POA: Insufficient documentation

## 2015-11-16 DIAGNOSIS — Z1239 Encounter for other screening for malignant neoplasm of breast: Secondary | ICD-10-CM

## 2015-12-20 ENCOUNTER — Other Ambulatory Visit: Payer: Self-pay | Admitting: Physician Assistant

## 2016-02-01 ENCOUNTER — Other Ambulatory Visit: Payer: Self-pay

## 2016-02-01 DIAGNOSIS — E785 Hyperlipidemia, unspecified: Secondary | ICD-10-CM

## 2016-02-01 DIAGNOSIS — D649 Anemia, unspecified: Secondary | ICD-10-CM

## 2016-02-01 DIAGNOSIS — I10 Essential (primary) hypertension: Secondary | ICD-10-CM

## 2016-02-07 ENCOUNTER — Ambulatory Visit: Payer: Self-pay | Admitting: Physician Assistant

## 2016-02-14 ENCOUNTER — Ambulatory Visit: Payer: Self-pay | Admitting: Physician Assistant

## 2016-02-14 ENCOUNTER — Encounter: Payer: Self-pay | Admitting: Physician Assistant

## 2016-02-14 VITALS — BP 144/82 | HR 70 | Ht 64.0 in | Wt 180.0 lb

## 2016-02-14 DIAGNOSIS — E669 Obesity, unspecified: Secondary | ICD-10-CM | POA: Insufficient documentation

## 2016-02-14 DIAGNOSIS — I10 Essential (primary) hypertension: Secondary | ICD-10-CM

## 2016-02-14 DIAGNOSIS — F17210 Nicotine dependence, cigarettes, uncomplicated: Secondary | ICD-10-CM

## 2016-02-14 DIAGNOSIS — F419 Anxiety disorder, unspecified: Secondary | ICD-10-CM

## 2016-02-14 NOTE — Progress Notes (Signed)
BP (!) 144/82 (BP Location: Left Arm, Patient Position: Sitting, Cuff Size: Normal)   Pulse 70   Ht 5\' 4"  (1.626 m)   Wt 180 lb (81.6 kg)   LMP 02/06/2016   SpO2 99%   BMI 30.90 kg/m    Subjective:    Patient ID: Claire Fowler, female    DOB: 12/13/1965, 50 y.o.   MRN: [id number]  HPI: Claire Fowler is a 50 y.o. female presenting on 02/14/2016 for Hypertension ( has not had lab drawn)   HPI   Pt called [facility] but she didn't go in. She says she isn't having the anxiety like she used to and doesn't feel that she needs to see someone for it.   Pt just finished her menses several days ago.   Relevant past medical, surgical, family and social history reviewed and updated as indicated. Interim medical history since our last visit reviewed. Allergies and medications reviewed and updated.   Current Outpatient Prescriptions:  .  benazepril (LOTENSIN) 40 MG tablet, Take 40 mg by mouth daily., Disp: , Rfl:  .  citalopram (CELEXA) 20 MG tablet, TAKE ONE TABLET BY MOUTH ONCE DAILY, Disp: 30 tablet, Rfl: 2 .  diphenhydrAMINE (BENADRYL) 25 MG tablet, Take 50 mg by mouth at bedtime., Disp: , Rfl:  .  ferrous fumarate (HEMOCYTE - 106 MG FE) 325 (106 FE) MG TABS tablet, Take 1 tablet by mouth daily. , Disp: , Rfl:  .  Fexofenadine HCl (MUCINEX ALLERGY PO), Take 1 tablet by mouth daily as needed (allergies). , Disp: , Rfl:  .  ibuprofen (ADVIL,MOTRIN) 600 MG tablet, Take 1 tablet (600 mg total) by mouth every 8 (eight) hours as needed., Disp: 30 tablet, Rfl: 0 .  loratadine (CLARITIN) 10 MG tablet, Take 10 mg by mouth daily as needed. Reported on 11/07/2015, Disp: , Rfl:  .  metoprolol (LOPRESSOR) 100 MG tablet, Take 1 tablet (100 mg total) by mouth 2 (two) times daily., Disp: 180 tablet, Rfl: 3 .  OVER THE COUNTER MEDICATION, , Disp: , Rfl:   Review of Systems  Constitutional: Positive for appetite change. Negative for chills, diaphoresis, fatigue, fever and unexpected weight change.  HENT:  Positive for dental problem. Negative for congestion, drooling, ear pain, facial swelling, hearing loss, mouth sores, sneezing, sore throat, trouble swallowing and voice change.   Eyes: Negative for pain, discharge, redness, itching and visual disturbance.  Respiratory: Negative for cough, choking, shortness of breath and wheezing.   Cardiovascular: Negative for chest pain, palpitations and leg swelling.  Gastrointestinal: Negative for abdominal pain, blood in stool, constipation, diarrhea and vomiting.  Endocrine: Negative for cold intolerance, heat intolerance and polydipsia.  Genitourinary: Negative for decreased urine volume, dysuria and hematuria.  Musculoskeletal: Negative for arthralgias, back pain and gait problem.  Skin: Negative for rash.  Allergic/Immunologic: Negative for environmental allergies.  Neurological: Negative for seizures, syncope, light-headedness and headaches.  Hematological: Negative for adenopathy.  Psychiatric/Behavioral: Negative for agitation, dysphoric mood and suicidal ideas. The patient is not nervous/anxious.     Per HPI unless specifically indicated above     Objective:    BP (!) 144/82 (BP Location: Left Arm, Patient Position: Sitting, Cuff Size: Normal)   Pulse 70   Ht 5\' 4"  (1.626 m)   Wt 180 lb (81.6 kg)   LMP 02/06/2016   SpO2 99%   BMI 30.90 kg/m   Wt Readings from Last 3 Encounters:  02/14/16 180 lb (81.6 kg)  11/07/15 172 lb 9.6  oz (78.3 kg)  10/20/15 170 lb (77.1 kg)    Physical Exam  Constitutional: She is oriented to person, place, and time. She appears well-developed and well-nourished.  HENT:  Head: Normocephalic and atraumatic.  Neck: Neck supple.  Cardiovascular: Normal rate and regular rhythm.   Pulmonary/Chest: Effort normal and breath sounds normal.  Abdominal: Soft. Bowel sounds are normal. She exhibits no mass. There is no hepatosplenomegaly. There is no tenderness.  Musculoskeletal: She exhibits no edema.   Lymphadenopathy:    She has no cervical adenopathy.  Neurological: She is alert and oriented to person, place, and time.  Skin: Skin is warm and dry.  Psychiatric: She has a normal mood and affect. Her behavior is normal.  Vitals reviewed.      Assessment & Plan:   Encounter Diagnoses  Name Primary?  . Essential hypertension, benign Yes  . Anxiety   . Obesity, unspecified   . Cigarette nicotine dependence, uncomplicated     -pt encouraged to Stop supplement- suspect it is increasing BP -counseled on healthy ways to get weight down, ie healthy eating and exercise -pt to get labs drawn -F/u 6 wk with PAP at that time

## 2016-03-21 ENCOUNTER — Other Ambulatory Visit: Payer: Self-pay | Admitting: Physician Assistant

## 2016-03-30 ENCOUNTER — Other Ambulatory Visit: Payer: Self-pay | Admitting: Physician Assistant

## 2016-04-02 ENCOUNTER — Ambulatory Visit: Payer: Self-pay

## 2016-05-05 ENCOUNTER — Other Ambulatory Visit: Payer: Self-pay | Admitting: Physician Assistant

## 2016-05-08 ENCOUNTER — Encounter: Payer: Self-pay | Admitting: Physician Assistant

## 2016-05-08 ENCOUNTER — Ambulatory Visit: Payer: Self-pay | Admitting: Physician Assistant

## 2016-05-08 VITALS — BP 126/78 | HR 73 | Temp 97.0°F | Ht 64.0 in | Wt 174.5 lb

## 2016-05-08 DIAGNOSIS — E785 Hyperlipidemia, unspecified: Secondary | ICD-10-CM

## 2016-05-08 DIAGNOSIS — F17210 Nicotine dependence, cigarettes, uncomplicated: Secondary | ICD-10-CM

## 2016-05-08 DIAGNOSIS — F419 Anxiety disorder, unspecified: Secondary | ICD-10-CM

## 2016-05-08 DIAGNOSIS — I10 Essential (primary) hypertension: Secondary | ICD-10-CM

## 2016-05-08 MED ORDER — BENAZEPRIL HCL 40 MG PO TABS: 40.0000 mg | ORAL_TABLET | Freq: Every day | ORAL | 1 refills | Status: DC

## 2016-05-08 MED ORDER — CITALOPRAM HYDROBROMIDE 20 MG PO TABS: 20.0000 mg | ORAL_TABLET | Freq: Every day | ORAL | 2 refills | Status: DC

## 2016-05-08 NOTE — Progress Notes (Signed)
BP 126/78 (BP Location: Left Arm, Patient Position: Sitting, Cuff Size: Normal)   Pulse 73   Temp 97 F (36.1 C)   Ht 5\' 4"  (1.626 m)   Wt 174 lb 8 oz (79.2 kg)   SpO2 98%   BMI 29.95 kg/m    Subjective:    Patient ID: Sheffield SliderLorraine J Murrill, female    DOB: 12/30/1965, 50 y.o.   MRN: [id number]  HPI: Sheffield SliderLorraine J Savastano is a 50 y.o. female presenting on 05/08/2016 for Follow-up   HPI    Pt past due for appointment  Pt is having some anxiety.  She still has contact information for counseling but hasn't called yet- says "it isn't to that point yet".   Pt out of her citalopram. She would like to get back on it.   Pt did not get her blood drawn- was supposed to after 02/14/16 OV.  Pt says her benazepril was changed and she feels different   Relevant past medical, surgical, family and social history reviewed and updated as indicated. Interim medical history since our last visit reviewed. Allergies and medications reviewed and updated.   Current Outpatient Prescriptions:  .  benazepril (LOTENSIN) 40 MG tablet, TAKE ONE TABLET BY MOUTH ONCE DAILY --  **NEEDS  APPOINTMENT**, Disp: 7 tablet, Rfl: 0 .  diphenhydrAMINE (BENADRYL) 25 MG tablet, Take 50 mg by mouth at bedtime., Disp: , Rfl:  .  ferrous fumarate (HEMOCYTE - 106 MG FE) 325 (106 FE) MG TABS tablet, Take 1 tablet by mouth daily. , Disp: , Rfl:  .  Fexofenadine HCl (MUCINEX ALLERGY PO), Take 1 tablet by mouth daily as needed (allergies). , Disp: , Rfl:  .  ibuprofen (ADVIL,MOTRIN) 600 MG tablet, Take 1 tablet (600 mg total) by mouth every 8 (eight) hours as needed., Disp: 30 tablet, Rfl: 0 .  loratadine (CLARITIN) 10 MG tablet, Take 10 mg by mouth daily as needed. Reported on 11/07/2015, Disp: , Rfl:  .  metoprolol (LOPRESSOR) 100 MG tablet, Take 1 tablet (100 mg total) by mouth 2 (two) times daily., Disp: 180 tablet, Rfl: 3 .  OVER THE COUNTER MEDICATION, , Disp: , Rfl:  .  citalopram (CELEXA) 20 MG tablet, TAKE ONE TABLET BY MOUTH ONCE  DAILY (Patient not taking: Reported on 05/08/2016), Disp: 30 tablet, Rfl: 2  Review of Systems  Constitutional: Positive for appetite change and fatigue. Negative for chills, diaphoresis, fever and unexpected weight change.  HENT: Positive for congestion, dental problem, sneezing and sore throat. Negative for drooling, ear pain, facial swelling, hearing loss, mouth sores, trouble swallowing and voice change.   Eyes: Negative for pain, discharge, redness, itching and visual disturbance.  Respiratory: Negative for cough, choking, shortness of breath and wheezing.   Cardiovascular: Negative for chest pain, palpitations and leg swelling.  Gastrointestinal: Negative for abdominal pain, blood in stool, constipation, diarrhea and vomiting.  Endocrine: Negative for cold intolerance, heat intolerance and polydipsia.  Genitourinary: Negative for decreased urine volume, dysuria and hematuria.  Musculoskeletal: Negative for arthralgias, back pain and gait problem.  Skin: Negative for rash.  Allergic/Immunologic: Negative for environmental allergies.  Neurological: Negative for seizures, syncope, light-headedness and headaches.  Hematological: Negative for adenopathy.  Psychiatric/Behavioral: Positive for dysphoric mood. Negative for agitation and suicidal ideas. The patient is nervous/anxious.     Per HPI unless specifically indicated above     Objective:    BP 126/78 (BP Location: Left Arm, Patient Position: Sitting, Cuff Size: Normal)   Pulse 73  Temp 97 F (36.1 C)   Ht 5\' 4"  (1.626 m)   Wt 174 lb 8 oz (79.2 kg)   SpO2 98%   BMI 29.95 kg/m   Wt Readings from Last 3 Encounters:  05/08/16 174 lb 8 oz (79.2 kg)  02/14/16 180 lb (81.6 kg)  11/07/15 172 lb 9.6 oz (78.3 kg)    Physical Exam  Constitutional: She is oriented to person, place, and time. She appears well-developed and well-nourished.  HENT:  Head: Normocephalic and atraumatic.  Neck: Neck supple.  Cardiovascular: Normal rate  and regular rhythm.   Pulmonary/Chest: Effort normal and breath sounds normal.  Abdominal: Soft. Bowel sounds are normal. She exhibits no mass. There is no hepatosplenomegaly. There is no tenderness.  Musculoskeletal: She exhibits no edema.  Lymphadenopathy:    She has no cervical adenopathy.  Neurological: She is alert and oriented to person, place, and time.  Skin: Skin is warm and dry.  Psychiatric: She has a normal mood and affect. Her behavior is normal.  Vitals reviewed.       Assessment & Plan:   Encounter Diagnoses  Name Primary?  . Essential hypertension, benign Yes  . Anxiety   . Hyperlipidemia, unspecified hyperlipidemia type   . Cigarette nicotine dependence, uncomplicated     -Pt doesn't want PAP. She was scheduled to get this but says she doesn't want one.   -pt to get labs ordered in August -continue current medications.  Citalopram renewed.  [facility] -counseled on smoking cessation -F/u 3 months.  RTO sooner prn

## 2016-06-28 ENCOUNTER — Ambulatory Visit: Payer: Self-pay | Admitting: Physician Assistant

## 2016-06-28 ENCOUNTER — Encounter: Payer: Self-pay | Admitting: Physician Assistant

## 2016-06-28 VITALS — BP 156/86 | HR 86 | Ht 64.0 in | Wt 171.4 lb

## 2016-06-28 DIAGNOSIS — J069 Acute upper respiratory infection, unspecified: Secondary | ICD-10-CM

## 2016-06-28 DIAGNOSIS — B009 Herpesviral infection, unspecified: Secondary | ICD-10-CM

## 2016-06-28 MED ORDER — ALBUTEROL SULFATE HFA 108 (90 BASE) MCG/ACT IN AERS: 2.0000 | INHALATION_SPRAY | Freq: Four times a day (QID) | RESPIRATORY_TRACT | 1 refills | Status: DC | PRN

## 2016-06-28 MED ORDER — BENZONATATE 100 MG PO CAPS: ORAL_CAPSULE | ORAL | 3 refills | Status: AC

## 2016-06-28 MED ORDER — ACYCLOVIR 400 MG PO TABS: 400.0000 mg | ORAL_TABLET | Freq: Three times a day (TID) | ORAL | 3 refills | Status: DC
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
## 2016-08-08 ENCOUNTER — Ambulatory Visit: Payer: Self-pay | Admitting: Physician Assistant

## 2016-08-16 ENCOUNTER — Ambulatory Visit: Payer: Self-pay | Admitting: Physician Assistant

## 2017-01-02 ENCOUNTER — Emergency Department (HOSPITAL_COMMUNITY)
Admission: EM | Admit: 2017-01-02 | Discharge: 2017-01-02 | Disposition: A | Discharge status: Home / Self Care | Payer: PRIVATE HEALTH INSURANCE | Attending: Emergency Medicine | Admitting: Emergency Medicine

## 2017-01-02 ENCOUNTER — Encounter (HOSPITAL_COMMUNITY): Payer: Self-pay | Admitting: *Deleted

## 2017-01-02 DIAGNOSIS — I10 Essential (primary) hypertension: Secondary | ICD-10-CM | POA: Insufficient documentation

## 2017-01-02 DIAGNOSIS — F17290 Nicotine dependence, other tobacco product, uncomplicated: Secondary | ICD-10-CM | POA: Insufficient documentation

## 2017-01-02 DIAGNOSIS — R55 Syncope and collapse: Secondary | ICD-10-CM | POA: Insufficient documentation

## 2017-01-02 DIAGNOSIS — Z79899 Other long term (current) drug therapy: Secondary | ICD-10-CM | POA: Insufficient documentation

## 2017-01-02 LAB — CBC WITH DIFFERENTIAL/PLATELET
Basophils Absolute: 0 10*3/uL (ref 0.0–0.1)
Basophils Relative: 0 %
Eosinophils Absolute: 0 10*3/uL (ref 0.0–0.7)
Eosinophils Relative: 0 %
HEMATOCRIT: 39.5 % (ref 36.0–46.0)
HEMOGLOBIN: 13 g/dL (ref 12.0–15.0)
LYMPHS PCT: 25 %
Lymphs Abs: 2.1 10*3/uL (ref 0.7–4.0)
MCH: 22.3 pg — ABNORMAL LOW (ref 26.0–34.0)
MCHC: 32.9 g/dL (ref 30.0–36.0)
MCV: 67.8 fL — AB (ref 78.0–100.0)
MONO ABS: 0.5 10*3/uL (ref 0.1–1.0)
MONOS PCT: 7 %
NEUTROS ABS: 5.6 10*3/uL (ref 1.7–7.7)
Neutrophils Relative %: 68 %
Platelets: 335 10*3/uL (ref 150–400)
RBC: 5.83 MIL/uL — ABNORMAL HIGH (ref 3.87–5.11)
RDW: 14.9 % (ref 11.5–15.5)
WBC: 8.2 10*3/uL (ref 4.0–10.5)

## 2017-01-02 LAB — COMPREHENSIVE METABOLIC PANEL
ALBUMIN: 4.7 g/dL (ref 3.5–5.0)
ALT: 37 U/L (ref 14–54)
AST: 31 U/L (ref 15–41)
Alkaline Phosphatase: 66 U/L (ref 38–126)
Anion gap: 11 (ref 5–15)
BUN: 21 mg/dL — AB (ref 6–20)
CHLORIDE: 106 mmol/L (ref 101–111)
CO2: 26 mmol/L (ref 22–32)
CREATININE: 1.08 mg/dL — AB (ref 0.44–1.00)
Calcium: 9.6 mg/dL (ref 8.9–10.3)
GFR calc Af Amer: >60 mL/min (ref >60)
GFR calc non Af Amer: 59 mL/min — ABNORMAL LOW (ref >60)
Glucose, Bld: 100 mg/dL — ABNORMAL HIGH (ref 65–99)
POTASSIUM: 4.4 mmol/L (ref 3.5–5.1)
Sodium: 143 mmol/L (ref 135–145)
Total Bilirubin: 0.8 mg/dL (ref 0.3–1.2)
Total Protein: 8.4 g/dL — ABNORMAL HIGH (ref 6.5–8.1)

## 2017-01-02 LAB — ETHANOL: Alcohol, Ethyl (B): 89 mg/dL — ABNORMAL HIGH (ref <5)

## 2017-01-02 LAB — TROPONIN I: Troponin I: <0.03 ng/mL (ref <0.03)

## 2017-01-02 MED ORDER — SODIUM CHLORIDE 0.9 % IV BOLUS (SEPSIS)
1000.0000 mL | Freq: Once | INTRAVENOUS | Status: AC
  Administered 2017-01-02: 1000 mL via INTRAVENOUS

## 2017-01-02 NOTE — ED Triage Notes (Signed)
Became over heated and passed out prior to arrival
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
## 2017-01-02 NOTE — ED Notes (Signed)
Pt refused to provide urine sample.  Pt left AMA during shift change.

## 2017-01-02 NOTE — ED Notes (Signed)
Pt had us a urine sample when she was brought back to her room. When this tech went to get the urine from the room, pt found the empty urine cup in the trash can. Pt then went to the bathroom again about 30 minutes later. I asked pt if she could get us another sample and she answered yes. Pt came out of the bathroom with an empty specimen cup.
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
## 2019-01-12 ENCOUNTER — Ambulatory Visit (HOSPITAL_COMMUNITY): Payer: Self-pay | Admitting: Hematology

## 2019-01-20 ENCOUNTER — Inpatient Hospital Stay (HOSPITAL_COMMUNITY): Payer: Self-pay

## 2019-01-20 ENCOUNTER — Other Ambulatory Visit: Payer: Self-pay

## 2019-01-20 ENCOUNTER — Inpatient Hospital Stay (HOSPITAL_COMMUNITY): Payer: Self-pay | Attending: Hematology | Admitting: Hematology

## 2019-01-20 ENCOUNTER — Encounter (HOSPITAL_COMMUNITY): Payer: Self-pay | Admitting: Hematology

## 2019-01-20 DIAGNOSIS — I10 Essential (primary) hypertension: Secondary | ICD-10-CM | POA: Insufficient documentation

## 2019-01-20 DIAGNOSIS — F17210 Nicotine dependence, cigarettes, uncomplicated: Secondary | ICD-10-CM | POA: Insufficient documentation

## 2019-01-20 DIAGNOSIS — Z79899 Other long term (current) drug therapy: Secondary | ICD-10-CM | POA: Insufficient documentation

## 2019-01-20 DIAGNOSIS — D509 Iron deficiency anemia, unspecified: Secondary | ICD-10-CM

## 2019-01-20 DIAGNOSIS — R7989 Other specified abnormal findings of blood chemistry: Secondary | ICD-10-CM | POA: Insufficient documentation

## 2019-01-20 DIAGNOSIS — F419 Anxiety disorder, unspecified: Secondary | ICD-10-CM | POA: Insufficient documentation

## 2019-01-20 DIAGNOSIS — F329 Major depressive disorder, single episode, unspecified: Secondary | ICD-10-CM | POA: Insufficient documentation

## 2019-01-20 LAB — FOLATE: Folate: 3.6 ng/mL — ABNORMAL LOW (ref >5.9)

## 2019-01-20 LAB — RETICULOCYTES
Immature Retic Fract: 16.8 % — ABNORMAL HIGH (ref 2.3–15.9)
RBC.: 4.54 MIL/uL (ref 3.87–5.11)
Retic Count, Absolute: 79.9 10*3/uL (ref 19.0–186.0)
Retic Ct Pct: 1.8 % (ref 0.4–3.1)

## 2019-01-20 LAB — VITAMIN B12: Vitamin B-12: 326 pg/mL (ref 180–914)

## 2019-01-20 LAB — HEPATIC FUNCTION PANEL
ALT: 29 U/L (ref 0–44)
AST: 32 U/L (ref 15–41)
Albumin: 4.5 g/dL (ref 3.5–5.0)
Alkaline Phosphatase: 64 U/L (ref 38–126)
Bilirubin, Direct: 0.1 mg/dL (ref 0.0–0.2)
Indirect Bilirubin: 0.5 mg/dL (ref 0.3–0.9)
Total Bilirubin: 0.6 mg/dL (ref 0.3–1.2)
Total Protein: 8 g/dL (ref 6.5–8.1)

## 2019-01-20 LAB — LACTATE DEHYDROGENASE: LDH: 221 U/L — ABNORMAL HIGH (ref 98–192)

## 2019-01-20 LAB — TSH: TSH: 1.164 u[IU]/mL (ref 0.350–4.500)

## 2019-01-20 MED ORDER — CITALOPRAM HYDROBROMIDE 20 MG PO TABS: 20.0000 mg | ORAL_TABLET | Freq: Every day | ORAL | 3 refills | Status: DC

## 2019-01-20 NOTE — Assessment & Plan Note (Addendum)
1.  Microcytic anemia and elevated ferritin:  -She was referred to [place] from health department for work-up of anemia. - Labs on 12/23/2018 shows white count of 4.9, hemoglobin 10.9, MCV 72.8, platelet count 407, low MCH and MCHC and elevated RDW. -Iron panel showed ferritin of 887, percent saturation of 19.  TIBC was 391.  Patient reports taking iron tablet at least twice a day since age 53. -CBC on 06/19/2018 showed hemoglobin 11.5 with an MCV of 69.8.  Platelet count was 374. -CBC from [facility] from June 2018 shows hemoglobin of 13 and MCV of 67.8. - Elevated ferritin likely reactive airways percent saturation was normal.  I would hold off on doing hemochromatosis testing as she requests lab testing to be kept to minimal as she is self-pay. - We will check her stool for occult blood.  She stopped menstruating a year ago.  Will check SPEP.  We will also check Z61 and folic acid, copper and LDH and reticulocyte count to complete anemia work-up. -She reports weight gain.  We will also check a TSH level today.  She had microcytosis even with a normal hemoglobin in 2018 indicating presence of thalassemia/sickle cell trait. -She will be coming back in 2 to 3 weeks to discuss results.  2.  Hypertension: -She is on benazepril 40 mg daily, HCTZ 25 mg daily and metoprolol 100 mg twice daily.  3.  Depression: -She used to take citalopram 20 mg daily.  She requests refill for it.  I have sent a prescription to her pharmacy.

## 2019-01-20 NOTE — Patient Instructions (Signed)
[place] at [facility] Discharge Instructions  You were seen today by Dr. Delton Coombes. He went over your history, family history and how you've been feeling lately. He will have blood work done today. He will see you back in 3 weeks for follow up.   Thank you for choosing [place] at [facility] to provide your oncology and hematology care.  To afford each patient quality time with our provider, please arrive at least 15 minutes before your scheduled appointment time.   If you have a lab appointment with the [place] please come in thru the  Main Entrance and check in at the main information desk  You need to re-schedule your appointment should you arrive 10 or more minutes late.  We strive to give you quality time with our providers, and arriving late affects you and other patients whose appointments are after yours.  Also, if you no show three or more times for appointments you may be dismissed from the clinic at the providers discretion.     Again, thank you for choosing [facility].  Our hope is that these requests will decrease the amount of time that you wait before being seen by our physicians.       _____________________________________________________________  Should you have questions after your visit to [facility], please contact our office at [phone] between the hours of 8:00 a.m. and 4:30 p.m.  Voicemails left after 4:00 p.m. will not be returned until the following business day.  For prescription refill requests, have your pharmacy contact our office and allow 72 hours.    Cancer Center Support Programs:   > Cancer Support Group  2nd Tuesday of the month 1pm-2pm, Journey Room

## 2019-01-20 NOTE — Progress Notes (Signed)
CONSULT NOTE  Patient Care Team: Patient, No Pcp Per as PCP - General (General Practice)  CHIEF COMPLAINTS/PURPOSE OF CONSULTATION:  Microcytic anemia and elevated ferritin.  HISTORY OF PRESENTING ILLNESS:  Claire Fowler 53 y.o. female is 53 year old very pleasant African-American female who is seen in consultation today for further work-up and management of microcytic anemia and hyper ferritin anemia.  Labs from health department dated 12/23/2018 shows hemoglobin 10.9 with MCV of 72.8.  Ferritin was 887 and percent saturation was 19.  She reports taking iron tablet twice weekly since the age 26.  Denies any history of blood transfusion.  Reports weight gain which is recent.  She reportedly stopped taking her Celexa as she ran out of refills.  She complains of fatigue.  Stopped menstruating a year ago.  Denies any bleeding per rectum or melena.  Never had a colonoscopy.  Denies taking any over-the-counter NSAID medications.  Denies any pica.  No prior history of malignancy.  No family history of sickle cell or thalassemia trait or disease.  Denies any Family history of malignancies. 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  She smokes 1 pack of cigarettes every week for the last 10 years.  She also drinks about half a pint of vodka on the weekends.  MEDICAL HISTORY:  Past Medical History:  Diagnosis Date  . Anxiety   . Hypertension 10-11-2014  . Low back pain   . Neck pain   . Seasonal allergies     SURGICAL HISTORY: Past Surgical History:  Procedure Laterality Date  . TUBAL LIGATION  08-1995    SOCIAL HISTORY: Social History   Socioeconomic History  . Marital status: Legally Separated    Spouse name: Not on file  . Number of children: Not on file  . Years of education: Not on file  . Highest education level: Not on file  Occupational History  . Occupation: Sales promotion account executive  Social Needs  . Financial resource strain: Not on file  . Food insecurity     Worry: Not on file    Inability: Not on file  . Transportation needs    Medical: Not on file    Non-medical: Not on file  Tobacco Use  . Smoking status: Current Some Day Smoker    Packs/day: 0.00    Years: 5.00    Pack years: 0.00    Types: Cigarettes, Cigars  . Smokeless tobacco: Never Used  . Tobacco comment: smokes 1 cigar every 3 days  Substance and Sexual Activity  . Alcohol use: No    Alcohol/week: 0.0 standard drinks  . Drug use: No    Frequency: 0.2 times per week  . Sexual activity: Yes    Birth control/protection: None  Lifestyle  . Physical activity    Days per week: Not on file    Minutes per session: Not on file  . Stress: Not on file  Relationships  . Social Herbalist on phone: Not on file    Gets together: Not on file    Attends religious service: Not on file    Active member of club or organization: Not on file    Attends meetings of clubs or organizations: Not on file    Relationship status: Not on file  . Intimate partner violence    Fear of current or ex partner: Not on file    Emotionally abused: Not on file    Physically abused: Not on file  Forced sexual activity: Not on file  Other Topics Concern  . Not on file  Social History Narrative  . Not on file    FAMILY HISTORY: Family History  Problem Relation Age of Onset  . Pneumonia Father        Deceased  . Diabetes Mother   . Hypertension Mother     ALLERGIES:  is allergic to pollen extract.  MEDICATIONS:  Current Outpatient Medications  Medication Sig Dispense Refill  . acyclovir (ZOVIRAX) 400 MG tablet Take 1 tablet (400 mg total) by mouth 3 (three) times daily. For 5 days at first sign herpes outbreak 15 tablet 3  . albuterol (PROVENTIL HFA;VENTOLIN HFA) 108 (90 Base) MCG/ACT inhaler Inhale 2 puffs into the lungs every 6 (six) hours as needed for wheezing or shortness of breath. 1 Inhaler 1  . benazepril (LOTENSIN) 40 MG tablet Take 1 tablet (40 mg total) by mouth  daily. 30 tablet 1  . diphenhydrAMINE (BENADRYL) 25 MG tablet Take 50 mg by mouth at bedtime.    . ferrous fumarate (HEMOCYTE - 106 MG FE) 325 (106 FE) MG TABS tablet Take 1 tablet by mouth daily.     . hydrochlorothiazide (HYDRODIURIL) 25 MG tablet Take 25 mg by mouth daily.    . citalopram (CELEXA) 20 MG tablet Take 1 tablet (20 mg total) by mouth daily. 30 tablet 3  . metoprolol (LOPRESSOR) 100 MG tablet Take 1 tablet (100 mg total) by mouth 2 (two) times daily. 180 tablet 3   No current facility-administered medications for this visit.     REVIEW OF SYSTEMS:   Constitutional: Denies fevers, chills or abnormal night sweats Eyes: Denies blurriness of vision, double vision or watery eyes Ears, nose, mouth, throat, and face: Denies mucositis or sore throat Respiratory: Denies cough, dyspnea or wheezes Cardiovascular: Denies palpitation, chest discomfort or lower extremity swelling Gastrointestinal:  Denies nausea, heartburn or change in bowel habits Skin: Denies abnormal skin rashes Lymphatics: Denies new lymphadenopathy or easy bruising Neurological:Denies numbness, tingling or new weaknesses Behavioral/Psych: Mood is stable, no new changes.  Reports sleep problems. All other systems were reviewed with the patient and are negative.  PHYSICAL EXAMINATION: ECOG PERFORMANCE STATUS: 1 - Symptomatic but completely ambulatory  Vitals:   01/20/19 1309  BP: (!) 164/76  Pulse: 80  Resp: 18  Temp: 97.9 F (36.6 C)  SpO2: 97%   Filed Weights   01/20/19 1309  Weight: 177 lb 6.4 oz (80.5 kg)    GENERAL:alert, no distress and comfortable SKIN: skin color, texture, turgor are normal, no rashes or significant lesions EYES: normal, conjunctiva are pink and non-injected, sclera clear OROPHARYNX:no exudate, no erythema and lips, buccal mucosa, and tongue normal  NECK: supple, thyroid normal size, non-tender, without nodularity LYMPH:  no palpable lymphadenopathy in the cervical, axillary  or inguinal LUNGS: clear to auscultation and percussion with normal breathing effort HEART: regular rate & rhythm and no murmurs and no lower extremity edema ABDOMEN:abdomen soft, non-tender and normal bowel sounds Musculoskeletal:no cyanosis of digits and no clubbing  PSYCH: alert & oriented x 3 with fluent speech NEURO: no focal motor/sensory deficits  LABORATORY DATA:  I have reviewed the data as listed  RADIOGRAPHIC STUDIES: I have personally reviewed the radiological images as listed and agreed with the findings in the report.  ASSESSMENT & PLAN:  Microcytic anemia 1.  Microcytic anemia and elevated ferritin:  -She was referred to us from health department for work-up of anemia. - Labs on 12/23/2018 shows white  count of 4.9, hemoglobin 10.9, MCV 72.8, platelet count 407, low MCH and MCHC and elevated RDW. -Iron panel showed ferritin of 887, percent saturation of 19.  TIBC was 391.  Patient reports taking iron tablet at least twice a day since age 53. -CBC on 06/19/2018 showed hemoglobin 11.5 with an MCV of 69.8.  Platelet count was 374. -CBC from [facility] from June 2018 shows hemoglobin of 13 and MCV of 67.8. - Elevated ferritin likely reactive airways percent saturation was normal.  I would hold off on doing hemochromatosis testing as she requests lab testing to be kept to minimal as she is self-pay. - We will check her stool for occult blood.  She stopped menstruating a year ago.  Will check SPEP.  We will also check B12 and folic acid, copper and LDH and reticulocyte count to complete anemia work-up. -She reports weight gain.  We will also check a TSH level today.  She had microcytosis even with a normal hemoglobin in 2018 indicating presence of thalassemia/sickle cell trait. -She will be coming back in 2 to 3 weeks to discuss results.  2.  Hypertension: -She is on benazepril 40 mg daily, HCTZ 25 mg daily and metoprolol 100 mg twice daily.  3.  Depression: -She used to take  citalopram 20 mg daily.  She requests refill for it.  I have sent a prescription to her pharmacy.     All questions were answered. The patient knows to call the clinic with any problems, questions or concerns.     Doreatha MassedSreedhar Concettina Leth, MD 01/20/19 2:00 PM

## 2019-01-21 LAB — PROTEIN ELECTROPHORESIS, SERUM
A/G Ratio: 1.3 (ref 0.7–1.7)
Albumin ELP: 4 g/dL (ref 2.9–4.4)
Alpha-1-Globulin: 0.2 g/dL (ref 0.0–0.4)
Alpha-2-Globulin: 0.9 g/dL (ref 0.4–1.0)
Beta Globulin: 1 g/dL (ref 0.7–1.3)
Gamma Globulin: 1 g/dL (ref 0.4–1.8)
Globulin, Total: 3.1 g/dL (ref 2.2–3.9)
Total Protein ELP: 7.1 g/dL (ref 6.0–8.5)

## 2019-01-22 LAB — COPPER, SERUM: Copper: 141 ug/dL (ref 72–166)

## 2019-02-10 ENCOUNTER — Ambulatory Visit (HOSPITAL_COMMUNITY): Payer: Self-pay | Admitting: Hematology

## 2019-07-17 ENCOUNTER — Ambulatory Visit: Payer: HRSA Program | Attending: Internal Medicine

## 2019-07-17 ENCOUNTER — Other Ambulatory Visit: Payer: Self-pay

## 2019-07-17 DIAGNOSIS — Z20822 Contact with and (suspected) exposure to covid-19: Secondary | ICD-10-CM | POA: Diagnosis not present

## 2019-07-18 LAB — NOVEL CORONAVIRUS, NAA: SARS-CoV-2, NAA: NOT DETECTED

## 2020-04-24 ENCOUNTER — Emergency Department (HOSPITAL_COMMUNITY)
Admission: EM | Admit: 2020-04-24 | Discharge: 2020-04-24 | Disposition: A | Discharge status: Home / Self Care | Payer: No Typology Code available for payment source | Attending: Emergency Medicine | Admitting: Emergency Medicine

## 2020-04-24 ENCOUNTER — Other Ambulatory Visit: Payer: Self-pay

## 2020-04-24 ENCOUNTER — Encounter (HOSPITAL_COMMUNITY): Payer: Self-pay | Admitting: Emergency Medicine

## 2020-04-24 ENCOUNTER — Emergency Department (HOSPITAL_COMMUNITY): Payer: No Typology Code available for payment source

## 2020-04-24 DIAGNOSIS — I10 Essential (primary) hypertension: Secondary | ICD-10-CM | POA: Insufficient documentation

## 2020-04-24 DIAGNOSIS — F17210 Nicotine dependence, cigarettes, uncomplicated: Secondary | ICD-10-CM | POA: Diagnosis not present

## 2020-04-24 DIAGNOSIS — Z79899 Other long term (current) drug therapy: Secondary | ICD-10-CM | POA: Insufficient documentation

## 2020-04-24 DIAGNOSIS — S299XXA Unspecified injury of thorax, initial encounter: Secondary | ICD-10-CM | POA: Diagnosis present

## 2020-04-24 DIAGNOSIS — M542 Cervicalgia: Secondary | ICD-10-CM | POA: Insufficient documentation

## 2020-04-24 DIAGNOSIS — S20212A Contusion of left front wall of thorax, initial encounter: Secondary | ICD-10-CM | POA: Diagnosis not present

## 2020-04-24 MED ORDER — IBUPROFEN 600 MG PO TABS: 600.0000 mg | ORAL_TABLET | Freq: Four times a day (QID) | ORAL | 0 refills | Status: AC | PRN

## 2020-04-24 MED ORDER — IBUPROFEN 800 MG PO TABS
800.0000 mg | ORAL_TABLET | Freq: Once | ORAL | Status: AC
  Administered 2020-04-24: 800 mg via ORAL
  Filled 2020-04-24: qty 1

## 2020-04-24 MED ORDER — HYDROCODONE-ACETAMINOPHEN 5-325 MG PO TABS: 1.0000 | ORAL_TABLET | ORAL | 0 refills | Status: DC | PRN

## 2020-04-24 MED ORDER — HYDROCODONE-ACETAMINOPHEN 5-325 MG PO TABS
1.0000 | ORAL_TABLET | Freq: Once | ORAL | Status: AC
  Administered 2020-04-24: 1 via ORAL
  Filled 2020-04-24: qty 1
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
## 2021-08-31 ENCOUNTER — Ambulatory Visit: Payer: Self-pay | Admitting: Nurse Practitioner

## 2021-10-27 ENCOUNTER — Encounter: Payer: Self-pay | Admitting: Nurse Practitioner

## 2021-10-27 ENCOUNTER — Ambulatory Visit (INDEPENDENT_AMBULATORY_CARE_PROVIDER_SITE_OTHER): Payer: 59 | Admitting: Nurse Practitioner

## 2021-10-27 VITALS — BP 101/62 | HR 74 | Ht 62.0 in | Wt 162.0 lb

## 2021-10-27 DIAGNOSIS — G8929 Other chronic pain: Secondary | ICD-10-CM

## 2021-10-27 DIAGNOSIS — Z1231 Encounter for screening mammogram for malignant neoplasm of breast: Secondary | ICD-10-CM | POA: Diagnosis not present

## 2021-10-27 DIAGNOSIS — F419 Anxiety disorder, unspecified: Secondary | ICD-10-CM | POA: Diagnosis not present

## 2021-10-27 DIAGNOSIS — Z1211 Encounter for screening for malignant neoplasm of colon: Secondary | ICD-10-CM

## 2021-10-27 DIAGNOSIS — F32A Depression, unspecified: Secondary | ICD-10-CM

## 2021-10-27 DIAGNOSIS — I10 Essential (primary) hypertension: Secondary | ICD-10-CM

## 2021-10-27 DIAGNOSIS — M25512 Pain in left shoulder: Secondary | ICD-10-CM | POA: Diagnosis not present

## 2021-10-27 MED ORDER — CITALOPRAM HYDROBROMIDE 20 MG PO TABS: 20.0000 mg | ORAL_TABLET | Freq: Every day | ORAL | 3 refills | Status: DC

## 2021-10-27 NOTE — Assessment & Plan Note (Signed)
Chronic condition ?Celexa 20 mg daily refilled ?Denies SI, HI  ?PHQ-9 score 21, GAD-7 score 14 ?Referred for therapy ? ?

## 2021-10-27 NOTE — Assessment & Plan Note (Signed)
Chronic condition ?On Flexeril 10 mg twice daily as needed ?Ibuprofen 600 mg every 6 hours as needed ?Continue current medications ?Patient referred to orthopedics ?

## 2021-10-27 NOTE — Progress Notes (Signed)
? ?New Patient Office Visit ? ?Subjective   ? ?Patient ID: Claire Fowler, female    DOB: 1965/11/10  Age: 56 y.o. MRN: [id number] ? ?CC:  ?Chief Complaint  ?Patient presents with  ? New Patient (Initial Visit)  ?  np  ? Shoulder Pain  ?  Since getting covid vaccine in 2021  ? Neck Pain  ?  Since getting covid vaccine 2021  ? ? ?HPI ?Claire Fowler with past medical history of hypertension, hyperlipidemia, obesity, anxiety presents to establish care for her chronic medical conditions. [facility].Last visit was in Feb., 2023.  ? ? Anxiety and depression. Has been been having depression and anxiety since about a year ago, was on celexa. She has been out of celexa20mg  daily , would like medication refilled today.  Patient denies SI, HI ? ?Hypertension . chlorthalidone 25 mg daily, metopoprlol 100 mg daily, olmesartan, not sure of dosage of olmesartan. patient denies chest pain, dizziness, edema.  ? ?Chronic left shoulder pain pt stated that she has been having left shoulder dull aching pain radiating to her neck since she got COVID vaccine in 2020. She has tried flexeril but its not helping , its hard to lift things, denies numbness, tingling , pain is 10/10. ?  ? ?Patient is due for Pap smear, colonoscopy, mammogram, shingles vaccine, Tdap vaccine.  Referral sent for colonoscopy and mammogram today.  Patient encouraged to get her Tdap vaccine, shingles vaccine, COVID vaccines at her pharmacy.  ? ?Records requested from [facility] ? ? ? ?Outpatient Encounter Medications as of 10/27/2021  ?Medication Sig  ? chlorthalidone (HYGROTON) 25 MG tablet Take 25 mg by mouth daily. Take 1 tablet by mouth daily; for bp replaces hctz  ? Cholecalciferol (VITAMIN D3) 25 MCG (1000 UT) CAPS Take by mouth. Once daily  ? cyclobenzaprine (FLEXERIL) 10 MG tablet Take 10 mg by mouth 2 (two) times daily as needed for muscle spasms.  ? diphenhydrAMINE (BENADRYL) 25 MG tablet Take 50 mg by mouth at  bedtime.  ? fenofibrate (TRICOR) 145 MG tablet Take 145 mg by mouth daily.  ? ferrous fumarate (HEMOCYTE - 106 MG FE) 325 (106 FE) MG TABS tablet Take 1 tablet by mouth daily.  ? fluticasone (FLONASE) 50 MCG/ACT nasal spray Place into both nostrils daily.  ? ibuprofen (ADVIL) 600 MG tablet Take 1 tablet (600 mg total) by mouth every 6 (six) hours as needed.  ? metoprolol (LOPRESSOR) 100 MG tablet Take 1 tablet (100 mg total) by mouth 2 (two) times daily.  ? Omega-3 Fatty Acids (FISH OIL) 1000 MG CAPS Take by mouth. Once daily  ? [DISCONTINUED] benazepril (LOTENSIN) 40 MG tablet Take 1 tablet (40 mg total) by mouth daily.  ? acyclovir (ZOVIRAX) 400 MG tablet Take 1 tablet (400 mg total) by mouth 3 (three) times daily. For 5 days at first sign herpes outbreak (Patient not taking: Reported on 10/27/2021)  ? albuterol (PROVENTIL HFA;VENTOLIN HFA) 108 (90 Base) MCG/ACT inhaler Inhale 2 puffs into the lungs every 6 (six) hours as needed for wheezing or shortness of breath. (Patient not taking: Reported on 10/27/2021)  ? citalopram (CELEXA) 20 MG tablet Take 1 tablet (20 mg total) by mouth daily.  ? [DISCONTINUED] citalopram (CELEXA) 20 MG tablet Take 1 tablet (20 mg total) by mouth daily. (Patient not taking: Reported on 10/27/2021)  ? [DISCONTINUED] hydrochlorothiazide (HYDRODIURIL) 25 MG tablet Take 25 mg by mouth daily. (Patient not taking: Reported on 10/27/2021)  ? [  DISCONTINUED] HYDROcodone-acetaminophen (NORCO/VICODIN) 5-325 MG tablet Take 1 tablet by mouth every 4 (four) hours as needed. (Patient not taking: Reported on 10/27/2021)  ? ?No facility-administered encounter medications on file as of 10/27/2021.  ? ? ?Past Medical History:  ?Diagnosis Date  ? Anxiety   ? Hypertension 10-11-2014  ? Low back pain   ? Neck pain   ? Seasonal allergies   ? ? ?Past Surgical History:  ?Procedure Laterality Date  ? TUBAL LIGATION  08-1995  ? ? ?Family History  ?Problem Relation Age of Onset  ? Pneumonia Father   ?     Deceased  ?  Diabetes Mother   ? Hypertension Mother   ? ? ?Social History  ? ?Socioeconomic History  ? Marital status: Legally Separated  ?  Spouse name: Not on file  ? Number of children: Not on file  ? Years of education: Not on file  ? Highest education level: Not on file  ?Occupational History  ? Occupation: Sales promotion account executive  ?Tobacco Use  ? Smoking status: Some Days  ?  Packs/day: 0.00  ?  Years: 5.00  ?  Pack years: 0.00  ?  Types: Cigarettes, Cigars  ? Smokeless tobacco: Never  ? Tobacco comments:  ?  smokes 1 cigar every 3 days  ?Substance and Sexual Activity  ? Alcohol use: Yes  ?  Comment: every other day  ? Drug use: No  ?  Frequency: 0.2 times per week  ? Sexual activity: Yes  ?  Birth control/protection: None  ?Other Topics Concern  ? Not on file  ?Social History Narrative  ? Not on file  ? ?Social Determinants of Health  ? ?Financial Resource Strain: Not on file  ?Food Insecurity: Not on file  ?Transportation Needs: Not on file  ?Physical Activity: Not on file  ?Stress: Not on file  ?Social Connections: Not on file  ?Intimate Partner Violence: Not on file  ? ? ?Review of Systems  ?Constitutional: Negative.   ?Eyes: Negative.   ?Respiratory: Negative.    ?Genitourinary: Negative.   ?Musculoskeletal:  Positive for joint pain.  ?Neurological: Negative.   ?Psychiatric/Behavioral: Negative.    ? ?  ? ? ?Objective   ? ?BP 101/62 (BP Location: Right Arm, Patient Position: Sitting, Cuff Size: Large)   Pulse 74   Ht 5\' 2"  (1.575 m)   Wt 162 lb (73.5 kg)   SpO2 97%   BMI 29.63 kg/m?  ? ?Physical Exam ?Constitutional:   ?   General: She is not in acute distress. ?   Appearance: Normal appearance. She is not ill-appearing, toxic-appearing or diaphoretic.  ?Cardiovascular:  ?   Rate and Rhythm: Normal rate and regular rhythm.  ?   Pulses: Normal pulses.  ?   Heart sounds: Normal heart sounds. No murmur heard. ?  No friction rub. No gallop.  ?Pulmonary:  ?   Effort: Pulmonary effort is normal. No respiratory distress.  ?    Breath sounds: Normal breath sounds. No stridor. No wheezing, rhonchi or rales.  ?Chest:  ?   Chest wall: No tenderness.  ?Abdominal:  ?   Palpations: Abdomen is soft.  ?Musculoskeletal:     ?   General: Tenderness present. No swelling, deformity or signs of injury.  ?   Right lower leg: No edema.  ?   Left lower leg: No edema.  ?   Comments: Tenderness on range of motion of left shoulder  ?Neurological:  ?   Mental Status: She is alert  and oriented to person, place, and time.  ?Psychiatric:     ?   Mood and Affect: Mood normal.     ?   Behavior: Behavior normal.     ?   Thought Content: Thought content normal.     ?   Judgment: Judgment normal.  ? ? ? ?  ? ?Assessment & Plan:  ? ?Problem List Items Addressed This Visit   ? ?  ? Cardiovascular and Mediastinum  ? Hypertension  ?  BP Readings from Last 3 Encounters:  ?10/27/21 101/62  ?04/24/20 (!) 174/85  ?01/20/19 (!) 164/76  ?Chronic condition well-controlled ?chlorthalidone 25 mg daily, metopoprlol 100 mg daily, olmesartan, not sure of dosage of olmesartan ?Continue current medications ?DASH diet advised engage in regular exercises at least 150 minutes weekly ?CMP at next visit ?  ?  ? Relevant Medications  ? fenofibrate (TRICOR) 145 MG tablet  ? chlorthalidone (HYGROTON) 25 MG tablet  ?  ? Other  ? Anxiety and depression - Primary  ?  Chronic condition ?Celexa 20 mg daily refilled ?Denies SI, HI  ?PHQ-9 score 21, GAD-7 score 14 ?Referred for therapy ? ? ?  ?  ? Relevant Medications  ? citalopram (CELEXA) 20 MG tablet  ? Other Relevant Orders  ? AMB Referral to [facility]  ? Chronic left shoulder pain  ?  Chronic condition ?On Flexeril 10 mg twice daily as needed ?Ibuprofen 600 mg every 6 hours as needed ?Continue current medications ?Patient referred to orthopedics ? ?  ?  ? Relevant Medications  ? cyclobenzaprine (FLEXERIL) 10 MG tablet  ? citalopram (CELEXA) 20 MG tablet  ? Other Relevant Orders  ? Ambulatory referral to Orthopedic Surgery   ? ?Other Visit Diagnoses   ? ? Screening for colon cancer      ? Relevant Orders  ? Ambulatory referral to Gastroenterology  ? Encounter for screening mammogram for malignant neoplasm of breast      ? Relevant Orders

## 2021-10-27 NOTE — Assessment & Plan Note (Deleted)
Chronic condition ?Celexa 20 mg daily refilled ?Denies SI, HI ?

## 2021-10-27 NOTE — Assessment & Plan Note (Addendum)
BP Readings from Last 3 Encounters:  ?10/27/21 101/62  ?04/24/20 (!) 174/85  ?01/20/19 (!) 164/76  ?Chronic condition well-controlled ?chlorthalidone 25 mg daily, metopoprlol 100 mg daily, olmesartan, not sure of dosage of olmesartan ?Continue current medications ?DASH diet advised engage in regular exercises at least 150 minutes weekly ?CMP at next visit ?

## 2021-10-27 NOTE — Patient Instructions (Addendum)

## 2021-10-30 ENCOUNTER — Telehealth: Payer: Self-pay | Admitting: *Deleted

## 2021-10-30 NOTE — Chronic Care Management (AMB) (Signed)
?  Care Management  ? ?Note ? ?10/30/2021 ?Name: Claire Fowler MRN: [id number] DOB: 1966/06/02 ? ?Claire Fowler is a 56 y.o. year old female who is a primary care patient of Donell Beers, FNP. I reached out to Sheffield Slider by phone today offer care coordination services.  ? ?[name] was given information about care management services today including:  ?Care management services include personalized support from designated clinical staff supervised by her physician, including individualized plan of care and coordination with other care providers ?24/7 contact phone numbers for assistance for urgent and routine care needs. ?The patient may stop care management services at any time by phone call to the office staff. ? ?Patient agreed to services and verbal consent obtained.  ? ?Follow up plan: ?Face to Face appointment with care management team member scheduled for: 11/02/21 ? ?Gwenevere Ghazi  ?Care Guide, Embedded Care Coordination ?[facility]  Care Management  ?Direct Dial: [phone] ? ?

## 2021-11-02 ENCOUNTER — Ambulatory Visit: Payer: 59

## 2021-11-02 DIAGNOSIS — I10 Essential (primary) hypertension: Secondary | ICD-10-CM

## 2021-11-02 DIAGNOSIS — F32A Depression, unspecified: Secondary | ICD-10-CM

## 2021-11-02 DIAGNOSIS — G8929 Other chronic pain: Secondary | ICD-10-CM
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
## 2021-11-03 ENCOUNTER — Ambulatory Visit (INDEPENDENT_AMBULATORY_CARE_PROVIDER_SITE_OTHER): Payer: 59

## 2021-11-03 ENCOUNTER — Encounter: Payer: Self-pay | Admitting: Orthopedic Surgery

## 2021-11-03 ENCOUNTER — Ambulatory Visit (INDEPENDENT_AMBULATORY_CARE_PROVIDER_SITE_OTHER): Payer: 59 | Admitting: Orthopedic Surgery

## 2021-11-03 VITALS — BP 98/58 | HR 75 | Ht 62.0 in | Wt 161.0 lb

## 2021-11-03 DIAGNOSIS — M7582 Other shoulder lesions, left shoulder: Secondary | ICD-10-CM

## 2021-11-03 DIAGNOSIS — M25512 Pain in left shoulder: Secondary | ICD-10-CM

## 2021-11-03 DIAGNOSIS — G8929 Other chronic pain: Secondary | ICD-10-CM | POA: Diagnosis not present

## 2021-11-03 MED ORDER — DICLOFENAC SODIUM 50 MG PO TBEC: 50.0000 mg | DELAYED_RELEASE_TABLET | Freq: Two times a day (BID) | ORAL | 0 refills | Status: DC

## 2021-11-03 NOTE — Progress Notes (Addendum)
New Patient Visit ? ?Assessment: ?Claire Fowler is a 56 y.o. RHD female with the following: ?1. Tendinitis of left rotator cuff ? ?Plan: ?Claire Fowler has pain in the left shoulder.  This has been ongoing for the past 2 years.  Atraumatic onset.  Pain started after she had her COVID vaccination.  She has tried over-the-counter NSAIDs, with limited improvement in her symptoms.  We discussed multiple treatment options, and she would like to proceed with an injection.  I have provided her with home exercises, and she should initiate these as soon as she feels up to it.  She is not interested in physical therapy at this time.  Otherwise, she has good range of motion, but some associated weakness. ? ? ?Procedure note injection Left shoulder  ?  ?Verbal consent was obtained to inject the left shoulder, subacromial space ?Timeout was completed to confirm the site of injection.  The skin was prepped with alcohol and ethyl chloride was sprayed at the injection site.  ?A 21-gauge needle was used to inject 40 mg of Depo-Medrol and 1% lidocaine (3 cc) into the subacromial space of the left shoulder using a posterolateral approach.  ?There were no complications. A sterile bandage was applied. ? ? ?Follow-up: ?Return if symptoms worsen or fail to improve. ? ?Subjective: ? ?Chief Complaint  ?Patient presents with  ? Shoulder Pain  ?  Left shoulder pain   ? ? ?History of Present Illness: ?Claire Fowler is a 56 y.o. female who has been referred by  Edwin Dada, FNP for evaluation of left shoulder pain.  She has had pain in the shoulder for the past 2 years.  She states she started to have issues following her second COVID vaccination.  She noticed a lot of swelling over the lateral shoulder.  From there, she progressively had worsening pain proximally into her neck, and upper chest area on the left.  She states the pain is severe at times.  She has not been using her left arm as a result.  She has not done any therapy.   She has tried some ibuprofen, with limited improvement in her symptoms.  She has never had a steroid injection. ? ? ?Review of Systems: ?No fevers or chills ?No numbness or tingling ?No chest pain ?No shortness of breath ?No bowel or bladder dysfunction ?No GI distress ?No headaches ? ? ?Medical History: ? ?Past Medical History:  ?Diagnosis Date  ? Anxiety   ? Hypertension 10-11-2014  ? Low back pain   ? Neck pain   ? Seasonal allergies   ? ? ?Past Surgical History:  ?Procedure Laterality Date  ? TUBAL LIGATION  08-1995  ? ? ?Family History  ?Problem Relation Age of Onset  ? Pneumonia Father   ?     Deceased  ? Diabetes Mother   ? Hypertension Mother   ? ?Social History  ? ?Tobacco Use  ? Smoking status: Some Days  ?  Packs/day: 0.00  ?  Years: 5.00  ?  Pack years: 0.00  ?  Types: Cigarettes, Cigars  ? Smokeless tobacco: Never  ? Tobacco comments:  ?  smokes 1 cigar every 3 days  ?Substance Use Topics  ? Alcohol use: Yes  ?  Comment: every other day  ? Drug use: No  ?  Frequency: 0.2 times per week  ? ? ?Allergies  ?Allergen Reactions  ? Pollen Extract Other (See Comments)  ?  congestion  ? ? ?Current Meds  ?Medication  Sig  ? acyclovir (ZOVIRAX) 400 MG tablet Take 1 tablet (400 mg total) by mouth 3 (three) times daily. For 5 days at first sign herpes outbreak  ? albuterol (PROVENTIL HFA;VENTOLIN HFA) 108 (90 Base) MCG/ACT inhaler Inhale 2 puffs into the lungs every 6 (six) hours as needed for wheezing or shortness of breath.  ? chlorthalidone (HYGROTON) 25 MG tablet Take 25 mg by mouth daily. Take 1 tablet by mouth daily; for bp replaces hctz  ? Cholecalciferol (VITAMIN D3) 25 MCG (1000 UT) CAPS Take by mouth. Once daily  ? citalopram (CELEXA) 20 MG tablet Take 1 tablet (20 mg total) by mouth daily.  ? cyclobenzaprine (FLEXERIL) 10 MG tablet Take 10 mg by mouth 2 (two) times daily as needed for muscle spasms.  ? diphenhydrAMINE (BENADRYL) 25 MG tablet Take 50 mg by mouth at bedtime.  ? fenofibrate (TRICOR) 145 MG  tablet Take 145 mg by mouth daily.  ? ferrous fumarate (HEMOCYTE - 106 MG FE) 325 (106 FE) MG TABS tablet Take 1 tablet by mouth daily.  ? fluticasone (FLONASE) 50 MCG/ACT nasal spray Place into both nostrils daily.  ? ibuprofen (ADVIL) 600 MG tablet Take 1 tablet (600 mg total) by mouth every 6 (six) hours as needed.  ? metoprolol (LOPRESSOR) 100 MG tablet Take 1 tablet (100 mg total) by mouth 2 (two) times daily.  ? Omega-3 Fatty Acids (FISH OIL) 1000 MG CAPS Take by mouth. Once daily  ? ? ?Objective: ?BP (!) 98/58   Pulse 75   Ht 5\' 2"  (1.575 m)   Wt 161 lb (73 kg)   BMI 29.45 kg/m?  ? ?Physical Exam: ? ?General: Alert and oriented. and No acute distress. ?Gait: Normal gait. ? ?Left shoulder without deformity.  No atrophy is appreciated.  She has full range of motion, with some obvious discomfort.  4+/5 strength testing.  Diffuse tenderness to palpation about the left shoulder.  Sensation is intact throughout the left hand.  2+ radial pulse. ? ?IMAGING: ?I personally ordered and reviewed the following images ? ?X-rays of the left shoulder were obtained in clinic today.  No evidence of acute injury.  Well-maintained glenohumeral joint space.  Minimal degenerative changes at the [facility] joint.  No evidence of proximal humeral migration.  Overall bone quality is good. ? ?Impression: Normal left shoulder x-ray ? ? ? ?New Medications:  ?No orders of the defined types were placed in this encounter. ? ? ? ? ?[facility], MD ? ?11/03/2021 ?10:11 AM ? ? ?

## 2021-11-03 NOTE — Patient Instructions (Addendum)
Instructions Following Joint Injections ? ?In clinic today, you received an injection in one of your joints (sometimes more than one).  Occasionally, you can have some pain at the injection site, this is normal.  You can place ice at the injection site, or take over-the-counter medications such as Tylenol (acetaminophen) or Advil (ibuprofen).  Please follow all directions listed on the bottle. ? ?If your joint (knee or shoulder) becomes swollen, red or very painful, please contact the clinic for additional assistance.  ? ?Two medications were injected, including lidocaine and a steroid (often referred to as cortisone).  Lidocaine is effective almost immediately but wears off quickly.  However, the steroid can take a few days to improve your symptoms.  In some cases, it can make your pain worse for a couple of days.  Do not be concerned if this happens as it is common.  You can apply ice or take some over-the-counter medications as needed.  ? ? ? ? ?Rotator Cuff Tear/Tendinitis Rehab  ? ?Ask your health care provider which exercises are safe for you. Do exercises exactly as told by your health care provider and adjust them as directed. It is normal to feel mild stretching, pulling, tightness, or discomfort as you do these exercises. Stop right away if you feel sudden pain or your pain gets worse. Do not begin these exercises until told by your health care provider. ?Stretching and range-of-motion exercises ? ?These exercises warm up your muscles and joints and improve the movement and flexibility of your shoulder. These exercises also help to relieve pain. ? ?Shoulder pendulum ?In this exercise, you let the injured arm dangle toward the floor and then swing it like a clock pendulum. ?Stand near a table or counter that you can hold onto for balance. ?Bend forward at the waist and let your left / right arm hang straight down. Use your other arm to support you and help you stay balanced. ?Relax your left / right arm and  shoulder muscles, and move your hips and your trunk so your left / right arm swings freely. Your arm should swing because of the motion of your body, not because you are using your arm or shoulder muscles. ?Keep moving your hips and trunk so your arm swings in the following directions, as told by your health care provider: ?Side to side. ?Forward and backward. ?In clockwise and counterclockwise circles. ?Slowly return to the starting position. ?Repeat 10 times, or for 10 seconds per direction. Complete this exercise 2-3 times a day. ?  ?   ?Shoulder flexion, seated ?This exercise is sometimes called table slides. In this exercise, you raise your arm in front of your body until you feel a stretch in your injured shoulder. ?Sit in a stable chair so your left / right forearm can rest on a flat surface. Your elbow should rest at a height that keeps your upper arm next to your body. ?Keeping your left / right shoulder relaxed, lean forward at the waist and let your hand slide forward (flexion). Stop when you feel a stretch in your shoulder, or when you reach the angle that is recommended by your health care provider. ?Hold for 5 seconds. ?Slowly return to the starting position. ?Repeat 10 times. Complete this exercise 1-2  times a day. ?      ?Shoulder flexion, standing ?In this exercise, you raise your arm in front of your body (flexion) until you feel a stretch in your injured shoulder. ?Stand and hold a broomstick,   a cane, or a similar object. Place your hands a little more than shoulder-width apart on the object. Your left / right hand should be palm-up, and your other hand should be palm-down. ?Keep your elbow straight and your shoulder muscles relaxed. Push the stick up with your healthy arm to raise your left / right arm in front of your body, and then over your head until you feel a stretch in your shoulder. ?Avoid shrugging your shoulder while you raise your arm. Keep your shoulder blade tucked down toward the  middle of your back. ?Keep your left / right shoulder muscles relaxed. ?Hold for 10 seconds. ?Slowly return to the starting position. ?Repeat 10 times. Complete this exercise 1-2 times a day. ?  ?   ?Shoulder abduction, active-assisted ?You will need a stick, broom handle, or similar object to help you (assist) in doing this exercise. ?Lie on your back. This is the supine position. Hold a broomstick, a cane, or a similar object. ?Place your hands a little more than shoulder-width apart on the object. Your left / right hand should be palm-up, and your other hand should be palm-down. ?Keeping your shoulder relaxed, push the stick to raise your left / right arm out to your side (abduction) and then over your head. Use your other hand to help move the stick. Stop when you feel a stretch in your shoulder, or when you reach the angle that is recommended by your health care provider. ?Avoid shrugging your shoulder while you raise your arm. Keep your shoulder blade tucked down toward the middle of your back. ?Hold for 10 seconds. ?Slowly return to the starting position. ?Repeat 10 times. Complete this exercise 1-2 times a day. ?  ?   ?Shoulder flexion, active-assisted ?Lie on your back. You may bend your knees for comfort. ?Hold a broomstick, a cane, or a similar object so that your hands are about shoulder-width apart. Your palms should face toward your feet. ?Raise your left / right arm over your head, then behind your head toward the floor (flexion). Use your other hand to help you do this (active-assisted). Stop when you feel a gentle stretch in your shoulder, or when you reach the angle that is recommended by your health care provider. ?Hold for 10 seconds. ?Use the stick and your other arm to help you return your left / right arm to the starting position. ?Repeat 10 times. Complete this exercise 1-2 times a day. ?  ?   ?External rotation ?Sit in a stable chair without armrests, or stand up. ?Tuck a soft object, such  as a folded towel or a small ball, under your left / right upper arm. ?Hold a broomstick, a cane, or a similar object with your palms face-down, toward the floor. Bend your elbows to a 90-degree angle (right angle), and keep your hands about shoulder-width apart. ?Straighten your healthy arm and push the stick across your body, toward your left / right side. Keep your left / right arm bent. This will rotate your left / right forearm away from your body (external rotation). ?Hold for 10 seconds. ?Slowly return to the starting position. ?Repeat 10 times. Complete this exercise 1-2 times a day.  ?   ? ? ? ?Strengthening exercises ?These exercises build strength and endurance in your shoulder. Endurance is the ability to use your muscles for a long time, even after they get tired. Do not start doing these exercises until your health care provider approves. ?Shoulder flexion, isometric ?Stand   or sit in a doorway, facing the door frame. ?Keep your left / right arm straight and make a gentle fist with your hand. Place your fist against the door frame. Only your fist should be touching the frame. Keep your upper arm at your side. ?Gently press your fist against the door frame, as if you are trying to raise your arm above your head (isometric shoulder flexion). ?Avoid shrugging your shoulder while you press your hand into the door frame. Keep your shoulder blade tucked down toward the middle of your back. ?Hold for 10 seconds. ?Slowly release the tension, and relax your muscles completely before you repeat the exercise. ?Repeat 10 times. Complete this exercise 3 times per week. ?     ?Shoulder abduction, isometric ?Stand or sit in a doorway. Your left / right arm should be closest to the door frame. ?Keep your left / right arm straight, and place the back of your hand against the door frame. Only your hand should be touching the frame. Keep the rest of your arm close to your side. ?Gently press the back of your hand against  the door frame, as if you are trying to raise your arm out to the side (isometric shoulder abduction). ?Avoid shrugging your shoulder while you press your hand into the door frame. Keep your shoulder b

## 2021-11-29 ENCOUNTER — Encounter: Payer: Self-pay | Admitting: *Deleted

## 2021-12-14 ENCOUNTER — Telehealth: Payer: 59

## 2021-12-15 ENCOUNTER — Encounter: Payer: Self-pay | Admitting: Nurse Practitioner

## 2021-12-15 ENCOUNTER — Ambulatory Visit (INDEPENDENT_AMBULATORY_CARE_PROVIDER_SITE_OTHER): Payer: 59 | Admitting: Nurse Practitioner

## 2021-12-15 ENCOUNTER — Telehealth: Payer: Self-pay | Admitting: Nurse Practitioner

## 2021-12-15 DIAGNOSIS — B009 Herpesviral infection, unspecified: Secondary | ICD-10-CM | POA: Insufficient documentation

## 2021-12-15 MED ORDER — FENOFIBRATE 145 MG PO TABS: 145.0000 mg | ORAL_TABLET | Freq: Every day | ORAL | 1 refills | Status: AC

## 2021-12-15 MED ORDER — CHLORTHALIDONE 25 MG PO TABS
25.0000 mg | ORAL_TABLET | Freq: Every day | ORAL | 1 refills | Status: AC
Start: 2021-12-15 — End: ?

## 2021-12-15 MED ORDER — METOPROLOL TARTRATE 100 MG PO TABS: 100.0000 mg | ORAL_TABLET | Freq: Two times a day (BID) | ORAL | 1 refills | Status: AC

## 2021-12-15 MED ORDER — ACYCLOVIR 400 MG PO TABS: 400.0000 mg | ORAL_TABLET | Freq: Three times a day (TID) | ORAL | 3 refills | Status: DC

## 2021-12-15 MED ORDER — CITALOPRAM HYDROBROMIDE 20 MG PO TABS: 20.0000 mg | ORAL_TABLET | Freq: Every day | ORAL | 1 refills | Status: DC

## 2021-12-15 NOTE — Progress Notes (Signed)
Virtual Visit via Telephone Note  I connected with Claire Fowler @ on 12/15/21 at 0240pm by telephone and verified that I am speaking with the correct person using two identifiers.  Spent 7 minutes on this telephone encounter  Location: Patient: home Provider: office   I discussed the limitations, risks, security and privacy concerns of performing an evaluation and management service by telephone and the availability of in person appointments. I also discussed with the patient that there may be a patient responsible charge related to this service. The patient expressed understanding and agreed to proceed.   History of Present Illness: herpez simplex patient stated that has not  used acyclovir lately , states that her cold sores are stress related, would like a refill of her acyclovir. Fells like a cold sore is coming up. Has been acyclovir for over 10 years.  Patient denies fever, chills, malaise   Observations/Objective:   Assessment and Plan: Herpes simplex Has been on a acyclovir as needed for over 10 years Requesting for medication refill today. States that she feels like she is about having an outbreak Acyclovir 400mg  , Take 1 tablet (400 mg total) by mouth 3 (three) times daily. For 5 days at first sign herpes outbreak,   Follow Up Instructions:    I discussed the assessment and treatment plan with the patient. The patient was provided an opportunity to ask questions and all were answered. The patient agreed with the plan and demonstrated an understanding of the instructions.   The patient was advised to call back or seek an in-person evaluation if the symptoms worsen or if the condition fails to improve as anticipated.

## 2021-12-15 NOTE — Assessment & Plan Note (Signed)
Has been on a acyclovir as needed for over 10 years Requesting for medication refill today. States that she feels like she is about having an outbreak Acyclovir 400mg  , Take 1 tablet (400 mg total) by mouth 3 (three) times daily. For 5 days at first sign herpes outbreak,

## 2021-12-15 NOTE — Telephone Encounter (Signed)
Pt called stating that she is needing refills on her medications. She has not had any refills with [place] since becoming a new pt. She is wanting to know if you can please refill Acyclovir 400mg ?    [facility] [place]

## 2021-12-15 NOTE — Telephone Encounter (Signed)
Please advise last filled 2017

## 2021-12-22 ENCOUNTER — Ambulatory Visit: Payer: 59 | Admitting: Nurse Practitioner

## 2022-01-09 ENCOUNTER — Encounter: Payer: Self-pay | Admitting: Nurse Practitioner

## 2022-01-09 ENCOUNTER — Ambulatory Visit (INDEPENDENT_AMBULATORY_CARE_PROVIDER_SITE_OTHER): Payer: 59 | Admitting: Nurse Practitioner

## 2022-01-09 DIAGNOSIS — K047 Periapical abscess without sinus: Secondary | ICD-10-CM

## 2022-01-09 MED ORDER — AMOXICILLIN-POT CLAVULANATE 500-125 MG PO TABS: 1.0000 | ORAL_TABLET | Freq: Three times a day (TID) | ORAL | 0 refills | Status: AC

## 2022-01-09 MED ORDER — AMOXICILLIN-POT CLAVULANATE 500-125 MG PO TABS: 1.0000 | ORAL_TABLET | Freq: Two times a day (BID) | ORAL | 0 refills | Status: DC

## 2022-01-09 NOTE — Assessment & Plan Note (Addendum)
Gum pain for the past 5 days unrelieved with Tylenol and ibuprofen Rx Augmentin 500--125 mg 1 tablet 3 times daily for 10 days.  Tylenol 650 mg every 6 hours as needed

## 2022-01-09 NOTE — Progress Notes (Signed)
Virtual Visit via Telephone Note  I connected with Claire Fowler @ on 01/09/22 at 1250pm by telephone and verified that I am speaking with the correct person using two identifiers. I pent 8 minutes on this telephone encounter.   Location: Patient: home Provider: office   I discussed the limitations, risks, security and privacy concerns of performing an evaluation and management service by telephone and the availability of in person appointments. I also discussed with the patient that there may be a patient responsible charge related to this service. The patient expressed understanding and agreed to proceed.   History of Present Illness: Patient c/o upper left sided gum pain that started since the past 5 days, she stated that it feels like she has an abcess. Has pain 8/10, its hard to chew. Denies body aches, fever, malaise. She has taken ibuprofen and tylenol, but they did not help.   She stated that she had tooth infection in the past.   Observations/Objective:   Assessment and Plan: Tooth infection Gum pain for the past 5 days unrelieved with Tylenol and ibuprofen Rx Augmentin 500--125 mg 1 tablet 3 times daily for 10 days.  Tylenol 650 mg every 6 hours as needed   Follow Up Instructions:    I discussed the assessment and treatment plan with the patient. The patient was provided an opportunity to ask questions and all were answered. The patient agreed with the plan and demonstrated an understanding of the instructions.   The patient was advised to call back or seek an in-person evaluation if the symptoms worsen or if the condition fails to improve as anticipated.

## 2022-02-14 ENCOUNTER — Encounter: Payer: Self-pay | Admitting: *Deleted

## 2022-02-14 NOTE — Patient Instructions (Signed)
Procedure: colonoscopy  Estimated body mass index is 29.45 kg/m as calculated from the following:   Height as of 11/03/21: 5\' 2"  (1.575 m).   Weight as of 11/03/21: 161 lb (73 kg).   Have you had a colonoscopy before?  no  Do you have family history of colon cancer  no  Previous colonoscopy with polyps removed? no  Do you have a history colorectal cancer?   no  Are you diabetic?  no  Do you have a prosthetic or mechanical heart valve? no  Do you have a pacemaker/defibrillator?   no  Have you had endocarditis/atrial fibrillation?  no  Do you use supplemental oxygen/CPAP?  no  Have you had joint replacement within the last 12 months?  no  Do you tend to be constipated or have to use laxatives?  no   Do you have history of alcohol use? If yes, how much and how often.  no  Do you have history or are you using drugs? If yes, what do are you  using?  no  Have you ever had a stroke/heart attack?  no  Do you take weight loss medication? no  female patients,: have you had a hysterectomy? no                              are you post menopausal?  no                              do you still have your menstrual cycle? no    Date of last menstrual period.   Do you take any blood-thinning medications such as: (Plavix, aspirin, Coumadin, Aggrenox, Brilinta, Xarelto, Eliquis, Pradaxa, Savaysa or Effient)   If yes we need the name, milligram, dosage and who is prescribing doctor:               Current Outpatient Medications  Medication Sig Dispense Refill   acyclovir (ZOVIRAX) 400 MG tablet Take 1 tablet (400 mg total) by mouth 3 (three) times daily. For 5 days at first sign herpes outbreak 15 tablet 3   albuterol (PROVENTIL HFA;VENTOLIN HFA) 108 (90 Base) MCG/ACT inhaler Inhale 2 puffs into the lungs every 6 (six) hours as needed for wheezing or shortness of breath. 1 Inhaler 1   chlorthalidone (HYGROTON) 25 MG tablet Take 1 tablet (25 mg total) by mouth daily. Take 1 tablet by mouth  daily; for bp replaces hctz 90 tablet 1   Cholecalciferol (VITAMIN D3) 25 MCG (1000 UT) CAPS Take by mouth. Once daily     citalopram (CELEXA) 20 MG tablet Take 1 tablet (20 mg total) by mouth daily. 90 tablet 1   cyclobenzaprine (FLEXERIL) 10 MG tablet Take 10 mg by mouth 2 (two) times daily as needed for muscle spasms. (Patient not taking: Reported on 12/15/2021)     diclofenac (VOLTAREN) 50 MG EC tablet Take 1 tablet (50 mg total) by mouth 2 (two) times daily. 60 tablet 0   diphenhydrAMINE (BENADRYL) 25 MG tablet Take 50 mg by mouth at bedtime.     fenofibrate (TRICOR) 145 MG tablet Take 1 tablet (145 mg total) by mouth daily. 90 tablet 1   ferrous fumarate (HEMOCYTE - 106 MG FE) 325 (106 FE) MG TABS tablet Take 1 tablet by mouth daily.     fluticasone (FLONASE) 50 MCG/ACT nasal spray Place into both nostrils daily.  ibuprofen (ADVIL) 600 MG tablet Take 1 tablet (600 mg total) by mouth every 6 (six) hours as needed. 30 tablet 0   metoprolol tartrate (LOPRESSOR) 100 MG tablet Take 1 tablet (100 mg total) by mouth 2 (two) times daily. 180 tablet 1   Omega-3 Fatty Acids (FISH OIL) 1000 MG CAPS Take by mouth. Once daily     No current facility-administered medications for this visit.    Allergies  Allergen Reactions   Pollen Extract Other (See Comments)    congestion

## 2022-02-15 ENCOUNTER — Encounter: Payer: Self-pay | Admitting: Nurse Practitioner

## 2022-02-15 ENCOUNTER — Ambulatory Visit (INDEPENDENT_AMBULATORY_CARE_PROVIDER_SITE_OTHER): Payer: 59 | Admitting: Nurse Practitioner

## 2022-02-15 DIAGNOSIS — D509 Iron deficiency anemia, unspecified: Secondary | ICD-10-CM | POA: Insufficient documentation

## 2022-02-15 DIAGNOSIS — R7303 Prediabetes: Secondary | ICD-10-CM | POA: Diagnosis not present

## 2022-02-15 DIAGNOSIS — I10 Essential (primary) hypertension: Secondary | ICD-10-CM | POA: Diagnosis not present

## 2022-02-15 DIAGNOSIS — E559 Vitamin D deficiency, unspecified: Secondary | ICD-10-CM | POA: Diagnosis not present

## 2022-02-15 DIAGNOSIS — D508 Other iron deficiency anemias: Secondary | ICD-10-CM

## 2022-02-15 DIAGNOSIS — E781 Pure hyperglyceridemia: Secondary | ICD-10-CM

## 2022-02-15 NOTE — Assessment & Plan Note (Signed)
Check A1c Avoid sugar sweets soda

## 2022-02-15 NOTE — Assessment & Plan Note (Signed)
On metoprolol 100 mg twice daily, Hygroton 25 mg daily.  She is encouraged to check her blood pressure daily at home BP goal is less than 140/90 DASH diet advised engage in regular moderate exercises at least 150 minutes weekly Check CMP

## 2022-02-15 NOTE — Patient Instructions (Signed)
Please schedule fasting labs done as discussed Please call the office to schedule your next appointment Your blood pressure goal is less than 140/90  It is important that you exercise regularly at least 30 minutes 5 times a week.  Think about what you will eat, plan ahead. Choose " clean, green, fresh or frozen" over canned, processed or packaged foods which are more sugary, salty and fatty. 70 to 75% of food eaten should be vegetables and fruit. Three meals at set times with snacks allowed between meals, but they must be fruit or vegetables. Aim to eat over a 12 hour period , example 7 am to 7 pm, and STOP after  your last meal of the day. Drink water,generally about 64 ounces per day, no other drink is as healthy. Fruit juice is best enjoyed in a healthy way, by EATING the fruit.  Thanks for choosing [facility], we consider it a privelige to serve you.

## 2022-02-15 NOTE — Progress Notes (Signed)
Virtual Visit via Telephone Note  I connected with Claire Fowler @ on 02/15/22 at1250 pm  telephone and verified that I am speaking with the correct person using two identifiers.  I spent 11 minutes on this telephone encounter  Location: Patient: home Provider: office   I discussed the limitations, risks, security and privacy concerns of performing an evaluation and management service by telephone and the availability of in person appointments. I also discussed with the patient that there may be a patient responsible charge related to this service. The patient expressed understanding and agreed to proceed.   History of Present Illness: Claire Fowler with past medical history of hypertension, hyperlipidemia, prediabetes, anemia presents for follow-up for her chronic medical conditions, her current health insurance cancelling in September, would like labs done before then . She generally feels well.   Hypertension.  Currently on metoprolol 100 mg twice daily, Hygroton 25 mg daily.  She denies edema, chest pain, dizziness she has not been checking her blood pressure at home.  Hyperlipidemia.  Currently on fenofibrate 145 mg daily, omega-3 fatty acid 1000 mg daily.  Vitamin D deficiency.  On vitamin D 1000 units daily   Observations/Objective:   Assessment and Plan: Hypertension On metoprolol 100 mg twice daily, Hygroton 25 mg daily.  She is encouraged to check her blood pressure daily at home BP goal is less than 140/90 DASH diet advised engage in regular moderate exercises at least 150 minutes weekly Check CMP  Iron deficiency anemia Currently on ferrous fumarate 1 tablet daily Denies fatigue Check CBC  Prediabetes Check A1c Avoid sugar sweets soda  Vitamin D deficiency Currently on vitamin D 1000 units daily Check vitamin D levels  Hypertriglyceridemia Currently on omega 3 fatty acid 1000 mg daily, fenofibrate 145 mg daily Check fasting lipid panel Avoid fatty fried foods    Follow Up Instructions:    I discussed the assessment and treatment plan with the patient. The patient was provided an opportunity to ask questions and all were answered. The patient agreed with the plan and demonstrated an understanding of the instructions.   The patient was advised to call back or seek an in-person evaluation if the symptoms worsen or if the condition fails to improve as anticipated.

## 2022-02-15 NOTE — Assessment & Plan Note (Deleted)
Currently on omega 3 fatty acid 1000 mg daily, fenofibrate 145 mg daily Check fasting lipid panel Avoid fatty fried foods

## 2022-02-15 NOTE — Assessment & Plan Note (Signed)
Currently on ferrous fumarate 1 tablet daily Denies fatigue Check CBC

## 2022-02-15 NOTE — Assessment & Plan Note (Signed)
Currently on vitamin D 1000 units daily ?Check vitamin D levels ? ?

## 2022-02-15 NOTE — Assessment & Plan Note (Signed)
Currently on omega 3 fatty acid 1000 mg daily, fenofibrate 145 mg daily Check fasting lipid panel Avoid fatty fried foods 

## 2022-02-19 ENCOUNTER — Telehealth: Payer: Self-pay | Admitting: Internal Medicine

## 2022-02-19 NOTE — Telephone Encounter (Signed)
Pt is triage and form sent to be reviewed by providers

## 2022-02-19 NOTE — Telephone Encounter (Signed)
Pt LMOM over the weekend asking for [place] to schedule her colonoscopy before the end of the month due to her insurance. [phone]

## 2022-02-20 NOTE — Telephone Encounter (Signed)
Called pt and made aware we have sent form to be reviewed by provider

## 2022-02-23 ENCOUNTER — Encounter: Payer: 59 | Admitting: Nurse Practitioner

## 2022-02-28 NOTE — Progress Notes (Signed)
ASA 2. Will need BMP pre-op. Hold iron for 10 days prior to procedure.

## 2022-03-01 NOTE — Progress Notes (Signed)
Spoke with pt. Her insurance terms today. She has applied for new insurance but has not received it yet. She is going to call us once she gets her new insurance so she can schedule. FYI to Dewayne Hatch to inform referring provider

## 2022-03-06 ENCOUNTER — Other Ambulatory Visit: Payer: Self-pay | Admitting: Nurse Practitioner

## 2022-03-08 NOTE — Progress Notes (Signed)
Note made on Epic referral 

## 2022-03-09 ENCOUNTER — Encounter: Payer: 59 | Admitting: Nurse Practitioner

## 2022-05-01 ENCOUNTER — Other Ambulatory Visit: Payer: Self-pay | Admitting: Nurse Practitioner

## 2022-05-17 ENCOUNTER — Telehealth: Payer: Self-pay | Admitting: Family Medicine

## 2022-05-17 ENCOUNTER — Encounter: Payer: Self-pay | Admitting: Family Medicine

## 2022-05-21 ENCOUNTER — Encounter: Payer: Self-pay | Admitting: Family Medicine

## 2022-05-21 ENCOUNTER — Telehealth: Payer: Self-pay | Admitting: Family Medicine

## 2022-05-21 ENCOUNTER — Ambulatory Visit (INDEPENDENT_AMBULATORY_CARE_PROVIDER_SITE_OTHER): Payer: 59 | Admitting: Family Medicine

## 2022-05-21 DIAGNOSIS — B009 Herpesviral infection, unspecified: Secondary | ICD-10-CM | POA: Diagnosis not present

## 2022-05-21 MED ORDER — ACYCLOVIR 400 MG PO TABS: 400.0000 mg | ORAL_TABLET | Freq: Two times a day (BID) | ORAL | 0 refills | Status: AC

## 2022-05-21 NOTE — Telephone Encounter (Signed)
error 

## 2022-05-21 NOTE — Progress Notes (Signed)
Virtual Visit via Telephone Note   This visit type was conducted via telephone. This format is felt to be most appropriate for this patient at this time.  The patient did not have access to video technology/had technical difficulties with video requiring transitioning to audio format only (telephone).  All issues noted in this document were discussed and addressed.  No physical exam could be performed with this format.  Evaluation Performed:  Follow-up visit  Date:  05/21/2022   ID:  Claire Fowler, Claire Fowler [birth year], MRN [id number]  Patient Location: Home Provider Location: Clinic  Participants: Patient Location of Patient: Home Location of Provider: Clinic Consent was obtain for visit to be over via telehealth. I verified that I am speaking with the correct person using two identifiers.  PCP:  Gilmore Laroche, FNP   Chief Complaint: HSV 1 medication refill  History of Present Illness:    Claire Fowler is a 56 y.o. 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  She would like a refill of her acyclovir.  The patient does not have symptoms concerning for COVID-19 infection (fever, chills, cough, or new shortness of breath).   Past Medical, Surgical, Social History, Allergies, and Medications have been Reviewed.  Past Medical History:  Diagnosis Date   Anxiety    Hypertension 10-11-2014   Low back pain    Neck pain    Seasonal allergies    Past Surgical History:  Procedure Laterality Date   TUBAL LIGATION  08-1995     Current Meds  Medication Sig   acyclovir (ZOVIRAX) 400 MG tablet Take 1 tablet (400 mg total) by mouth 2 (two) times daily for 7 days.   albuterol (PROVENTIL HFA;VENTOLIN HFA) 108 (90 Base) MCG/ACT inhaler Inhale 2 puffs into the lungs every 6 (six) hours as needed for wheezing or shortness of breath.   chlorthalidone (HYGROTON) 25 MG tablet Take 1 tablet (25 mg total) by mouth daily. Take 1 tablet by mouth daily; for bp replaces  hctz   Cholecalciferol (VITAMIN D3) 25 MCG (1000 UT) CAPS Take by mouth. Once daily   citalopram (CELEXA) 20 MG tablet Take 1 tablet (20 mg total) by mouth daily.   cyclobenzaprine (FLEXERIL) 10 MG tablet Take 10 mg by mouth 2 (two) times daily as needed for muscle spasms.   diclofenac (VOLTAREN) 50 MG EC tablet Take 1 tablet (50 mg total) by mouth 2 (two) times daily.   diphenhydrAMINE (BENADRYL) 25 MG tablet Take 50 mg by mouth at bedtime.   fenofibrate (TRICOR) 145 MG tablet Take 1 tablet (145 mg total) by mouth daily.   ferrous fumarate (HEMOCYTE - 106 MG FE) 325 (106 FE) MG TABS tablet Take 1 tablet by mouth daily.   fluticasone (FLONASE) 50 MCG/ACT nasal spray Place into both nostrils daily.   ibuprofen (ADVIL) 600 MG tablet Take 1 tablet (600 mg total) by mouth every 6 (six) hours as needed.   metoprolol tartrate (LOPRESSOR) 100 MG tablet Take 1 tablet (100 mg total) by mouth 2 (two) times daily.   Omega-3 Fatty Acids (FISH OIL) 1000 MG CAPS Take by mouth. Once daily   [DISCONTINUED] acyclovir (ZOVIRAX) 400 MG tablet TAKE 1 TABLET BY MOUTH THREE TIMES DAILY FOR 5 DAYS AT FIRST SIGN OF HERPES OUTBREAK     Allergies:   Pollen extract   ROS:   Please see the history of present illness.     All other systems reviewed and are negative.  Labs/Other Tests and Data Reviewed:    Recent Labs: No results found for requested labs within last 365 days.   Recent Lipid Panel Lab Results  Component Value Date/Time   CHOL 178 05/19/2015 08:19 AM   TRIG 302 (H) 05/19/2015 08:19 AM   HDL 45 (L) 05/19/2015 08:19 AM   CHOLHDL 4.0 05/19/2015 08:19 AM   LDLCALC 73 05/19/2015 08:19 AM    Wt Readings from Last 3 Encounters:  11/03/21 161 lb (73 kg)  10/27/21 162 lb (73.5 kg)  04/24/20 150 lb (68 kg)     Objective:    Vital Signs:  There were no vitals taken for this visit.     ASSESSMENT & PLAN:   Herpes simplex 1 Refilled acyclovir 400 mg twice daily for 7 days  Time:   Today,  I have spent 10 minutes reviewing the chart, including problem list, medications, and with the patient with telehealth technology discussing the above problems.   Medication Adjustments/Labs and Tests Ordered: Current medicines are reviewed at length with the patient today.  Concerns regarding medicines are outlined above.   Tests Ordered: No orders of the defined types were placed in this encounter.   Medication Changes: Meds ordered this encounter  Medications   acyclovir (ZOVIRAX) 400 MG tablet    Sig: Take 1 tablet (400 mg total) by mouth 2 (two) times daily for 7 days.    Dispense:  14 tablet    Refill:  0     Note: This dictation was prepared with Dragon dictation along with smaller phrase technology. Similar sounding words can be transcribed inadequately or may not be corrected upon review. Any transcriptional errors that result from this process are unintentional.      Disposition:  Follow up  Signed, Gilmore Laroche, FNP  05/21/2022 3:11 PM     [facility]

## 2022-06-13 ENCOUNTER — Encounter: Payer: Self-pay | Admitting: Family Medicine

## 2022-08-01 ENCOUNTER — Encounter (HOSPITAL_COMMUNITY): Payer: Self-pay

## 2022-08-01 ENCOUNTER — Other Ambulatory Visit: Payer: Self-pay

## 2022-08-01 ENCOUNTER — Emergency Department (HOSPITAL_COMMUNITY)
Admission: EM | Admit: 2022-08-01 | Discharge: 2022-08-01 | Disposition: A | Discharge status: Home / Self Care | Payer: Medicaid Other | Attending: Emergency Medicine | Admitting: Emergency Medicine

## 2022-08-01 DIAGNOSIS — T2112XA Burn of first degree of abdominal wall, initial encounter: Secondary | ICD-10-CM | POA: Insufficient documentation

## 2022-08-01 DIAGNOSIS — S80211A Abrasion, right knee, initial encounter: Secondary | ICD-10-CM | POA: Diagnosis not present

## 2022-08-01 DIAGNOSIS — S80212A Abrasion, left knee, initial encounter: Secondary | ICD-10-CM | POA: Diagnosis not present

## 2022-08-01 DIAGNOSIS — Z23 Encounter for immunization: Secondary | ICD-10-CM | POA: Insufficient documentation

## 2022-08-01 DIAGNOSIS — X16XXXA Contact with hot heating appliances, radiators and pipes, initial encounter: Secondary | ICD-10-CM | POA: Insufficient documentation

## 2022-08-01 DIAGNOSIS — T300 Burn of unspecified body region, unspecified degree: Secondary | ICD-10-CM

## 2022-08-01 MED ORDER — TETANUS-DIPHTH-ACELL PERTUSSIS 5-2.5-18.5 LF-MCG/0.5 IM SUSY
0.5000 mL | PREFILLED_SYRINGE | Freq: Once | INTRAMUSCULAR | Status: AC
  Administered 2022-08-01: 0.5 mL via INTRAMUSCULAR
  Filled 2022-08-01: qty 0.5

## 2022-08-01 MED ORDER — SILVER SULFADIAZINE 1 % EX CREA: TOPICAL_CREAM | Freq: Once | CUTANEOUS | Status: DC

## 2022-08-01 MED ORDER — BACITRACIN ZINC 500 UNIT/GM EX OINT
TOPICAL_OINTMENT | Freq: Two times a day (BID) | CUTANEOUS | Status: DC
  Filled 2022-08-01: qty 0.9
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
## 2022-08-30 ENCOUNTER — Encounter: Payer: Self-pay | Admitting: Radiology

## 2022-10-02 ENCOUNTER — Encounter: Payer: Self-pay | Admitting: *Deleted

## 2023-02-13 NOTE — Progress Notes (Signed)
Referring Provider: Kara Pacer, NP  Primary Care Physician:  Kara Pacer, NP Primary Gastroenterologist:  Dr. Marletta Lor  Chief Complaint  Patient presents with   Colonoscopy    Also having issues with loss of appetite for past 6 mths    HPI:   Claire Fowler is a 57 y.o. female presenting today at the request of Nsumanganyi, Colleen Can, NP for loss of appetite and colon cancer screening.  Reviewed recent labs available under [facility] dated 11/27/2022.  Hemoglobin within normal limits at 11.2, but with microcytic indices.  CMP remarkable for creatinine 1.5, BUN 34, glucose 118, otherwise within normal limits.  Folate was low and B12 low normal in 2020.  Consult:  Lack of appetite for the last 6 months or so. Down 12 lbs since January. Thinks it is related to stress. Oldest son moved back in with 2 younger children (55-55 years old). Has anxiety and depression. PCP prescribed Remeron but she didn't want to take it because she though she needed to eat with it. No nausea. Randomly vomited yesterday but had not been. No heartburn or dysphagia.   Taking iron. Has been on this since her 42s or 30s. Chronic history of anemia. Can have about 5 Bms daily. Can wake up in the middle of the night with a BM about 2 times. This is chronic.  Stools are more frequently loose, but not usually watery. Some abdominal cramping prior to a BM. No brbpr or melena.  No specific dietary triggers. Bms usually after a meal.   Ibuprofen usually weekly for headache or abdominal cramping. 400 mg or so at a time.   No prior colonoscopy or EGD.  No family history of colon cancer, IBD.     Past Medical History:  Diagnosis Date   Anxiety    Hypertension 10-11-2014   Low back pain    Neck pain    Seasonal allergies     Past Surgical History:  Procedure Laterality Date   TUBAL LIGATION  08-1995    Current Outpatient Medications  Medication Sig Dispense Refill   chlorthalidone  (HYGROTON) 25 MG tablet Take 1 tablet (25 mg total) by mouth daily. Take 1 tablet by mouth daily; for bp replaces hctz 90 tablet 1   Cholecalciferol (VITAMIN D3) 25 MCG (1000 UT) CAPS Take by mouth. Once daily     cyclobenzaprine (FLEXERIL) 10 MG tablet Take 10 mg by mouth 2 (two) times daily as needed for muscle spasms.     diphenhydrAMINE (BENADRYL) 25 MG tablet Take 50 mg by mouth at bedtime.     fenofibrate (TRICOR) 145 MG tablet Take 1 tablet (145 mg total) by mouth daily. 90 tablet 1   ferrous fumarate (HEMOCYTE - 106 MG FE) 325 (106 FE) MG TABS tablet Take 1 tablet by mouth daily.     FLUoxetine (PROZAC) 20 MG capsule Take 20 mg by mouth daily.     fluticasone (FLONASE) 50 MCG/ACT nasal spray Place into both nostrils daily.     folic acid (FOLVITE) 1 MG tablet Take 1 mg by mouth daily.     ibuprofen (ADVIL) 600 MG tablet Take 1 tablet (600 mg total) by mouth every 6 (six) hours as needed. 30 tablet 0   lisinopril (ZESTRIL) 10 MG tablet Take 10 mg by mouth daily.     metoprolol tartrate (LOPRESSOR) 100 MG tablet Take 1 tablet (100 mg total) by mouth 2 (two) times daily. 180 tablet 1   Omega-3 Fatty Acids (FISH  OIL) 1000 MG CAPS Take by mouth. Once daily     mirtazapine (REMERON) 15 MG tablet Take 1 tablet every day by oral route at bedtime. (Patient not taking: Reported on 02/14/2023)     No current facility-administered medications for this visit.    Allergies as of 02/14/2023 - Review Complete 02/14/2023  Allergen Reaction Noted   Pollen extract Other (See Comments) 07/19/2015    Family History  Problem Relation Age of Onset   Diabetes Mother    Hypertension Mother    Pneumonia Father        Deceased   Colon cancer Neg Hx    Inflammatory bowel disease Neg Hx     Social History   Socioeconomic History   Marital status: Single    Spouse name: Not on file   Number of children: Not on file   Years of education: Not on file   Highest education level: Not on file   Occupational History   Occupation: Film/video editor  Tobacco Use   Smoking status: Some Days    Current packs/day: 0.00    Types: Cigarettes, Cigars   Smokeless tobacco: Never   Tobacco comments:    smokes 1 cigar every 3 days  Vaping Use   Vaping status: Never Used  Substance and Sexual Activity   Alcohol use: Yes    Comment: 5th of vodka   Drug use: No    Frequency: 0.2 times per week   Sexual activity: Yes    Birth control/protection: None  Other Topics Concern   Not on file  Social History Narrative   Not on file   Social Determinants of Health   Financial Resource Strain: Not on file  Food Insecurity: Not on file  Transportation Needs: Not on file  Physical Activity: Not on file  Stress: Stress Concern Present (11/02/2021)   [employer] of Occupational Health - Occupational Stress Questionnaire    Feeling of Stress : To some extent  Social Connections: Not on file  Intimate Partner Violence: Not on file    Review of Systems: Gen: Denies any fever, chills, cold or flulike symptoms, presyncope, syncope. CV: Denies chest pain, heart palpitations. Resp: Denies shortness of breath, cough. GI: See HPI GU : Denies urinary burning, urinary frequency, urinary hesitancy MS: Denies joint pain. Derm: Denies rash. Psych: Admits to anxiety/depression. Heme: See HPI  Physical Exam: BP 130/82 (BP Location: Right Arm, Patient Position: Sitting, Cuff Size: Normal)   Pulse 67   Temp 98.5 F (36.9 C) (Oral)   Ht 5\' 2"  (1.575 m)   Wt 158 lb 6.4 oz (71.8 kg)   LMP  (LMP Unknown)   SpO2 96%   BMI 28.97 kg/m  General:   Alert and oriented. Pleasant and cooperative. Well-nourished and well-developed.  Head:  Normocephalic and atraumatic. Eyes:  Without icterus, sclera clear and conjunctiva pink.  Ears:  Normal auditory acuity. Lungs:  Clear to auscultation bilaterally. No wheezes, rales, or rhonchi. No distress.  Heart:  S1, S2 present without murmurs appreciated.   Abdomen:  +BS, soft, non-tender and non-distended. No HSM noted. No guarding or rebound. No masses appreciated.  Rectal:  Deferred  Msk:  Symmetrical without gross deformities. Normal posture. Extremities:  Without edema. Neurologic:  Alert and  oriented x4;  grossly normal neurologically. Skin:  Intact without significant lesions or rashes. Psych:  Normal mood and affect.    Assessment:  57 year old female with history of anxiety, depression, HTN, HLD, anemia, presenting today to discuss scheduling  first-ever screening colonoscopy and also reporting lack of appetite/weight loss and chronic loose stools.   Colon cancer screening: No prior colonoscopy.  No family history of colon cancer.  She does have chronic loose stools as discussed below. No brbpr, melena, or Fhx of IBD.   Loose stools:  Chronic, frequent loose bowel movements during the day with nocturnal stools as well.  Reports stools are typically loose, but not watery.  Some abdominal cramping prior to a bowel movement.  May have IBS, but will need to rule out infectious etiology in light of nocturnal stools.  I will also screen for celiac disease.  She is also getting a colonoscopy for screening purposes which will help to further evaluate her symptoms. If stool studies/celiac screen are negative, consider course of Xifaxan for IBS.  Lack of appetite/weight loss: X 6 months.  12 pound weight loss since January.  Possibly related to significant stress, but unable to rule out upper GI pathology such as gastritis, duodenitis, PUD, H. pylori, gastric outlet obstruction, malignancy. She does take ibuprofen weekly.   We will arrange EGD for further evaluation.   Microcytic red blood cells: Recent labs 11/27/2022 with hemoglobin within normal limits at 11.2, but with microcytic indices.  Notably, patient is currently taking iron daily.  She reports chronic history of anemia.  She is never had EGD or colonoscopy.  Per chart review, she also  has history of folate deficiency and low normal B12 in 2020. I will recheck iron, B12, and folate levels at this time.  We are also proceeding with an upper endoscopy and colonoscopy in the near future.    Plan:  Iron panel, folate, B12, C. difficile toxin A/B with reflex, GI profile panel, TTG IgA, IgA. Proceed with upper endoscopy + colonoscopy with propofol by Dr. Marletta Lor in near future. The risks, benefits, and alternatives have been discussed with the patient in detail. The patient states understanding and desires to proceed.  ASA 2 Hold iron x 7 days prior to procedure. If stool studies and screen for celiac are negative, consider course of Xifaxan for IBS. Okay to go ahead and start taking Remeron at bedtime as prescribed by PCP. Avoid NSAIDs. Follow-up after procedures.   Ermalinda Memos, PA-C [facility] 02/14/2023

## 2023-02-13 NOTE — H&P (View-Only) (Signed)
Referring Provider: Kara Pacer, NP  Primary Care Physician:  Kara Pacer, NP Primary Gastroenterologist:  Dr. Marletta Lor  Chief Complaint  Patient presents with   Colonoscopy    Also having issues with loss of appetite for past 6 mths    HPI:   Claire Fowler is a 57 y.o. female presenting today at the request of Nsumanganyi, Colleen Can, NP for loss of appetite and colon cancer screening.  Reviewed recent labs available under [facility] dated 11/27/2022.  Hemoglobin within normal limits at 11.2, but with microcytic indices.  CMP remarkable for creatinine 1.5, BUN 34, glucose 118, otherwise within normal limits.  Folate was low and B12 low normal in 2020.  Consult:  Lack of appetite for the last 6 months or so. Down 12 lbs since January. Thinks it is related to stress. Oldest son moved back in with 2 younger children (55-55 years old). Has anxiety and depression. PCP prescribed Remeron but she didn't want to take it because she though she needed to eat with it. No nausea. Randomly vomited yesterday but had not been. No heartburn or dysphagia.   Taking iron. Has been on this since her 42s or 30s. Chronic history of anemia. Can have about 5 Bms daily. Can wake up in the middle of the night with a BM about 2 times. This is chronic.  Stools are more frequently loose, but not usually watery. Some abdominal cramping prior to a BM. No brbpr or melena.  No specific dietary triggers. Bms usually after a meal.   Ibuprofen usually weekly for headache or abdominal cramping. 400 mg or so at a time.   No prior colonoscopy or EGD.  No family history of colon cancer, IBD.     Past Medical History:  Diagnosis Date   Anxiety    Hypertension 10-11-2014   Low back pain    Neck pain    Seasonal allergies     Past Surgical History:  Procedure Laterality Date   TUBAL LIGATION  08-1995    Current Outpatient Medications  Medication Sig Dispense Refill   chlorthalidone  (HYGROTON) 25 MG tablet Take 1 tablet (25 mg total) by mouth daily. Take 1 tablet by mouth daily; for bp replaces hctz 90 tablet 1   Cholecalciferol (VITAMIN D3) 25 MCG (1000 UT) CAPS Take by mouth. Once daily     cyclobenzaprine (FLEXERIL) 10 MG tablet Take 10 mg by mouth 2 (two) times daily as needed for muscle spasms.     diphenhydrAMINE (BENADRYL) 25 MG tablet Take 50 mg by mouth at bedtime.     fenofibrate (TRICOR) 145 MG tablet Take 1 tablet (145 mg total) by mouth daily. 90 tablet 1   ferrous fumarate (HEMOCYTE - 106 MG FE) 325 (106 FE) MG TABS tablet Take 1 tablet by mouth daily.     FLUoxetine (PROZAC) 20 MG capsule Take 20 mg by mouth daily.     fluticasone (FLONASE) 50 MCG/ACT nasal spray Place into both nostrils daily.     folic acid (FOLVITE) 1 MG tablet Take 1 mg by mouth daily.     ibuprofen (ADVIL) 600 MG tablet Take 1 tablet (600 mg total) by mouth every 6 (six) hours as needed. 30 tablet 0   lisinopril (ZESTRIL) 10 MG tablet Take 10 mg by mouth daily.     metoprolol tartrate (LOPRESSOR) 100 MG tablet Take 1 tablet (100 mg total) by mouth 2 (two) times daily. 180 tablet 1   Omega-3 Fatty Acids (FISH  OIL) 1000 MG CAPS Take by mouth. Once daily     mirtazapine (REMERON) 15 MG tablet Take 1 tablet every day by oral route at bedtime. (Patient not taking: Reported on 02/14/2023)     No current facility-administered medications for this visit.    Allergies as of 02/14/2023 - Review Complete 02/14/2023  Allergen Reaction Noted   Pollen extract Other (See Comments) 07/19/2015    Family History  Problem Relation Age of Onset   Diabetes Mother    Hypertension Mother    Pneumonia Father        Deceased   Colon cancer Neg Hx    Inflammatory bowel disease Neg Hx     Social History   Socioeconomic History   Marital status: Single    Spouse name: Not on file   Number of children: Not on file   Years of education: Not on file   Highest education level: Not on file   Occupational History   Occupation: Film/video editor  Tobacco Use   Smoking status: Some Days    Current packs/day: 0.00    Types: Cigarettes, Cigars   Smokeless tobacco: Never   Tobacco comments:    smokes 1 cigar every 3 days  Vaping Use   Vaping status: Never Used  Substance and Sexual Activity   Alcohol use: Yes    Comment: 5th of vodka   Drug use: No    Frequency: 0.2 times per week   Sexual activity: Yes    Birth control/protection: None  Other Topics Concern   Not on file  Social History Narrative   Not on file   Social Determinants of Health   Financial Resource Strain: Not on file  Food Insecurity: Not on file  Transportation Needs: Not on file  Physical Activity: Not on file  Stress: Stress Concern Present (11/02/2021)   [employer] of Occupational Health - Occupational Stress Questionnaire    Feeling of Stress : To some extent  Social Connections: Not on file  Intimate Partner Violence: Not on file    Review of Systems: Gen: Denies any fever, chills, cold or flulike symptoms, presyncope, syncope. CV: Denies chest pain, heart palpitations. Resp: Denies shortness of breath, cough. GI: See HPI GU : Denies urinary burning, urinary frequency, urinary hesitancy MS: Denies joint pain. Derm: Denies rash. Psych: Admits to anxiety/depression. Heme: See HPI  Physical Exam: BP 130/82 (BP Location: Right Arm, Patient Position: Sitting, Cuff Size: Normal)   Pulse 67   Temp 98.5 F (36.9 C) (Oral)   Ht 5\' 2"  (1.575 m)   Wt 158 lb 6.4 oz (71.8 kg)   LMP  (LMP Unknown)   SpO2 96%   BMI 28.97 kg/m  General:   Alert and oriented. Pleasant and cooperative. Well-nourished and well-developed.  Head:  Normocephalic and atraumatic. Eyes:  Without icterus, sclera clear and conjunctiva pink.  Ears:  Normal auditory acuity. Lungs:  Clear to auscultation bilaterally. No wheezes, rales, or rhonchi. No distress.  Heart:  S1, S2 present without murmurs appreciated.   Abdomen:  +BS, soft, non-tender and non-distended. No HSM noted. No guarding or rebound. No masses appreciated.  Rectal:  Deferred  Msk:  Symmetrical without gross deformities. Normal posture. Extremities:  Without edema. Neurologic:  Alert and  oriented x4;  grossly normal neurologically. Skin:  Intact without significant lesions or rashes. Psych:  Normal mood and affect.    Assessment:  57 year old female with history of anxiety, depression, HTN, HLD, anemia, presenting today to discuss scheduling  first-ever screening colonoscopy and also reporting lack of appetite/weight loss and chronic loose stools.   Colon cancer screening: No prior colonoscopy.  No family history of colon cancer.  She does have chronic loose stools as discussed below. No brbpr, melena, or Fhx of IBD.   Loose stools:  Chronic, frequent loose bowel movements during the day with nocturnal stools as well.  Reports stools are typically loose, but not watery.  Some abdominal cramping prior to a bowel movement.  May have IBS, but will need to rule out infectious etiology in light of nocturnal stools.  I will also screen for celiac disease.  She is also getting a colonoscopy for screening purposes which will help to further evaluate her symptoms. If stool studies/celiac screen are negative, consider course of Xifaxan for IBS.  Lack of appetite/weight loss: X 6 months.  12 pound weight loss since January.  Possibly related to significant stress, but unable to rule out upper GI pathology such as gastritis, duodenitis, PUD, H. pylori, gastric outlet obstruction, malignancy. She does take ibuprofen weekly.   We will arrange EGD for further evaluation.   Microcytic red blood cells: Recent labs 11/27/2022 with hemoglobin within normal limits at 11.2, but with microcytic indices.  Notably, patient is currently taking iron daily.  She reports chronic history of anemia.  She is never had EGD or colonoscopy.  Per chart review, she also  has history of folate deficiency and low normal B12 in 2020. I will recheck iron, B12, and folate levels at this time.  We are also proceeding with an upper endoscopy and colonoscopy in the near future.    Plan:  Iron panel, folate, B12, C. difficile toxin A/B with reflex, GI profile panel, TTG IgA, IgA. Proceed with upper endoscopy + colonoscopy with propofol by Dr. Marletta Lor in near future. The risks, benefits, and alternatives have been discussed with the patient in detail. The patient states understanding and desires to proceed.  ASA 2 Hold iron x 7 days prior to procedure. If stool studies and screen for celiac are negative, consider course of Xifaxan for IBS. Okay to go ahead and start taking Remeron at bedtime as prescribed by PCP. Avoid NSAIDs. Follow-up after procedures.   Ermalinda Memos, PA-C [facility] 02/14/2023

## 2023-02-14 ENCOUNTER — Ambulatory Visit (INDEPENDENT_AMBULATORY_CARE_PROVIDER_SITE_OTHER): Payer: Medicaid Other | Admitting: Gastroenterology

## 2023-02-14 ENCOUNTER — Encounter: Payer: Self-pay | Admitting: Gastroenterology

## 2023-02-14 ENCOUNTER — Other Ambulatory Visit: Payer: Self-pay | Admitting: *Deleted

## 2023-02-14 ENCOUNTER — Encounter: Payer: Self-pay | Admitting: *Deleted

## 2023-02-14 VITALS — BP 130/82 | HR 67 | Temp 98.5°F | Ht 62.0 in | Wt 158.4 lb

## 2023-02-14 DIAGNOSIS — R630 Anorexia: Secondary | ICD-10-CM | POA: Diagnosis not present

## 2023-02-14 DIAGNOSIS — R718 Other abnormality of red blood cells: Secondary | ICD-10-CM | POA: Diagnosis not present

## 2023-02-14 DIAGNOSIS — R634 Abnormal weight loss: Secondary | ICD-10-CM

## 2023-02-14 DIAGNOSIS — R195 Other fecal abnormalities: Secondary | ICD-10-CM

## 2023-02-14 DIAGNOSIS — E538 Deficiency of other specified B group vitamins: Secondary | ICD-10-CM

## 2023-02-14 DIAGNOSIS — Z1211 Encounter for screening for malignant neoplasm of colon: Secondary | ICD-10-CM

## 2023-02-14 MED ORDER — PEG 3350-KCL-NA BICARB-NACL 420 G PO SOLR: 4000.0000 mL | Freq: Once | ORAL | 0 refills | Status: AC

## 2023-02-14 NOTE — Patient Instructions (Signed)
Please have labs and stool studies completed at [facility].  We will arrange for you to have a colonoscopy and upper endoscopy in the near future with Dr. Marletta Lor. You will need to hold iron for 7 days prior to your procedures.  I recommend that you go ahead and start taking Remeron at bedtime as prescribed by your primary care doctor.  Limit ibuprofen and other NSAID products is much as possible as these medications can cause inflammation within your GI tract.  We will follow-up with you in the office after your procedures.  Do not hesitate to call if you have questions or concerns prior to your next visit.  It was a pleasure to meet you today! I want to create trusting relationships with patients. If you receive a survey regarding your visit,  I greatly appreciate you taking time to fill this out on paper or through your MyChart. I value your feedback.  Ermalinda Memos, PA-C [facility]

## 2023-02-15 ENCOUNTER — Encounter: Payer: Self-pay | Admitting: Gastroenterology

## 2023-02-26 ENCOUNTER — Other Ambulatory Visit: Payer: Self-pay | Admitting: Family Medicine

## 2023-02-26 DIAGNOSIS — B009 Herpesviral infection, unspecified: Secondary | ICD-10-CM

## 2023-03-08 ENCOUNTER — Other Ambulatory Visit: Payer: Self-pay | Admitting: Gastroenterology

## 2023-03-11 LAB — IRON,TIBC AND FERRITIN PANEL
Ferritin: 802 ng/mL — ABNORMAL HIGH (ref 15–150)
Iron Saturation: 26 % (ref 15–55)
Iron: 90 ug/dL (ref 27–159)
Total Iron Binding Capacity: 349 ug/dL (ref 250–450)
UIBC: 259 ug/dL (ref 131–425)

## 2023-03-11 LAB — TISSUE TRANSGLUTAMINASE, IGA: Transglutaminase IgA: <2 U/mL (ref 0–3)

## 2023-03-11 LAB — C DIFFICILE, CYTOTOXIN B

## 2023-03-11 LAB — C DIFFICILE TOXINS A+B W/RFLX: C difficile Toxins A+B, EIA: NEGATIVE

## 2023-03-11 LAB — IGA: IgA/Immunoglobulin A, Serum: 184 mg/dL (ref 87–352)

## 2023-03-11 LAB — FOLATE: Folate: 3 ng/mL — ABNORMAL LOW (ref >3.0)

## 2023-03-11 LAB — VITAMIN B12: Vitamin B-12: 474 pg/mL (ref 232–1245)

## 2023-03-12 ENCOUNTER — Encounter (HOSPITAL_COMMUNITY): Payer: Self-pay

## 2023-03-12 ENCOUNTER — Ambulatory Visit (HOSPITAL_COMMUNITY)
Admission: RE | Admit: 2023-03-12 | Discharge: 2023-03-12 | Disposition: A | Discharge status: Home / Self Care | Payer: Medicaid Other | Attending: Internal Medicine | Admitting: Internal Medicine

## 2023-03-12 ENCOUNTER — Encounter (HOSPITAL_COMMUNITY)
Admission: RE | Disposition: A | Discharge status: Home / Self Care | Payer: Self-pay | Source: Home / Self Care | Attending: Internal Medicine

## 2023-03-12 ENCOUNTER — Ambulatory Visit (HOSPITAL_BASED_OUTPATIENT_CLINIC_OR_DEPARTMENT_OTHER): Payer: Medicaid Other | Admitting: Registered Nurse

## 2023-03-12 ENCOUNTER — Other Ambulatory Visit: Payer: Self-pay

## 2023-03-12 ENCOUNTER — Ambulatory Visit (HOSPITAL_COMMUNITY): Payer: Medicaid Other | Admitting: Registered Nurse

## 2023-03-12 DIAGNOSIS — K2970 Gastritis, unspecified, without bleeding: Secondary | ICD-10-CM

## 2023-03-12 DIAGNOSIS — Z1211 Encounter for screening for malignant neoplasm of colon: Secondary | ICD-10-CM

## 2023-03-12 DIAGNOSIS — D126 Benign neoplasm of colon, unspecified: Secondary | ICD-10-CM | POA: Diagnosis not present

## 2023-03-12 DIAGNOSIS — K648 Other hemorrhoids: Secondary | ICD-10-CM | POA: Diagnosis not present

## 2023-03-12 DIAGNOSIS — R630 Anorexia: Secondary | ICD-10-CM | POA: Insufficient documentation

## 2023-03-12 DIAGNOSIS — D123 Benign neoplasm of transverse colon: Secondary | ICD-10-CM

## 2023-03-12 DIAGNOSIS — D509 Iron deficiency anemia, unspecified: Secondary | ICD-10-CM | POA: Diagnosis present

## 2023-03-12 DIAGNOSIS — Z6829 Body mass index (BMI) 29.0-29.9, adult: Secondary | ICD-10-CM | POA: Insufficient documentation

## 2023-03-12 DIAGNOSIS — R634 Abnormal weight loss: Secondary | ICD-10-CM | POA: Insufficient documentation

## 2023-03-12 DIAGNOSIS — E785 Hyperlipidemia, unspecified: Secondary | ICD-10-CM | POA: Insufficient documentation

## 2023-03-12 DIAGNOSIS — F419 Anxiety disorder, unspecified: Secondary | ICD-10-CM | POA: Insufficient documentation

## 2023-03-12 DIAGNOSIS — F17210 Nicotine dependence, cigarettes, uncomplicated: Secondary | ICD-10-CM | POA: Insufficient documentation

## 2023-03-12 DIAGNOSIS — B9681 Helicobacter pylori [H. pylori] as the cause of diseases classified elsewhere: Secondary | ICD-10-CM | POA: Diagnosis not present

## 2023-03-12 DIAGNOSIS — F32A Depression, unspecified: Secondary | ICD-10-CM | POA: Insufficient documentation

## 2023-03-12 DIAGNOSIS — I10 Essential (primary) hypertension: Secondary | ICD-10-CM | POA: Diagnosis not present

## 2023-03-12 HISTORY — PX: BIOPSY: SHX5522

## 2023-03-12 HISTORY — PX: ESOPHAGOGASTRODUODENOSCOPY (EGD) WITH PROPOFOL: SHX5813

## 2023-03-12 HISTORY — PX: COLONOSCOPY WITH PROPOFOL: SHX5780

## 2023-03-12 HISTORY — PX: POLYPECTOMY: SHX149

## 2023-03-12 SURGERY — COLONOSCOPY WITH PROPOFOL
Anesthesia: General

## 2023-03-12 MED ORDER — PHENYLEPHRINE HCL (PRESSORS) 10 MG/ML IV SOLN
INTRAVENOUS | Status: DC | PRN
Start: 2023-03-12 — End: 2023-03-12
  Administered 2023-03-12: 160 ug via INTRAVENOUS

## 2023-03-12 MED ORDER — LACTATED RINGERS IV SOLN: INTRAVENOUS | Status: DC

## 2023-03-12 MED ORDER — LIDOCAINE HCL (CARDIAC) PF 100 MG/5ML IV SOSY
PREFILLED_SYRINGE | INTRAVENOUS | Status: DC | PRN
Start: 2023-03-12 — End: 2023-03-12
  Administered 2023-03-12: 100 mg via INTRATRACHEAL

## 2023-03-12 MED ORDER — PROPOFOL 500 MG/50ML IV EMUL
INTRAVENOUS | Status: DC | PRN
  Administered 2023-03-12: 150 ug/kg/min via INTRAVENOUS

## 2023-03-12 MED ORDER — PROPOFOL 10 MG/ML IV BOLUS
INTRAVENOUS | Status: DC | PRN
Start: 2023-03-12 — End: 2023-03-12
  Administered 2023-03-12: 20 mg via INTRAVENOUS
  Administered 2023-03-12 (×2): 50 mg via INTRAVENOUS
  Administered 2023-03-12: 30 mg via INTRAVENOUS

## 2023-03-12 NOTE — Op Note (Signed)
[facility] Patient Name: Claire Fowler Procedure Date: 03/12/2023 10:58 AM MRN: [id number] Date of Birth: 06-10-1966 Attending MD: Hennie Duos. Marletta Lor , [place], [id number] CSN: [id number] Age: 57 Admit Type: Outpatient Procedure:                Colonoscopy Indications:              Screening for colorectal malignant neoplasm Providers:                Hennie Duos. Marletta Lor, DO, Crystal Page, Elinor Parkinson, Dyann Ruddle Referring MD:              Medicines:                See the Anesthesia note for documentation of the                            administered medications Complications:            No immediate complications. Estimated Blood Loss:     Estimated blood loss was minimal. Procedure:                Pre-Anesthesia Assessment:                           - The anesthesia plan was to use monitored                            anesthesia care (MAC).                           After obtaining informed consent, the colonoscope                            was passed under direct vision. Throughout the                            procedure, the patient's blood pressure, pulse, and                            oxygen saturations were monitored continuously. The                            [id number]-HQ190L ([id number]) scope was introduced through                            the anus and advanced to the the cecum, identified                            by appendiceal orifice and ileocecal valve. The                            colonoscopy was performed without difficulty. The                            patient tolerated the procedure well.  The quality                            of the bowel preparation was evaluated using the                            BBPS [facility] Bowel Preparation Scale) with scores                            of: Right Colon = 3, Transverse Colon = 3 and Left                            Colon = 3 (entire mucosa seen well with no residual                             staining, small fragments of stool or opaque                            liquid). The total BBPS score equals 9. Scope In: 11:22:48 AM Scope Out: 11:35:03 AM Scope Withdrawal Time: 0 hours 10 minutes 9 seconds  Total Procedure Duration: 0 hours 12 minutes 15 seconds  Findings:      Non-bleeding internal hemorrhoids were found during endoscopy.      Two sessile polyps were found in the transverse colon. The polyps were 5       to 6 mm in size. These polyps were removed with a cold snare. Resection       and retrieval were complete.      Biopsies for histology were taken with a cold forceps from the ascending       colon, transverse colon and descending colon for evaluation of       microscopic colitis. Impression:               - Non-bleeding internal hemorrhoids.                           - Two 5 to 6 mm polyps in the transverse colon,                            removed with a cold snare. Resected and retrieved.                           - Biopsies were taken with a cold forceps from the                            ascending colon, transverse colon and descending                            colon for evaluation of microscopic colitis. Moderate Sedation:      Per Anesthesia Care Recommendation:           - Patient has a contact number available for                            emergencies. The signs and symptoms of potential  delayed complications were discussed with the                            patient. Return to normal activities tomorrow.                            Written discharge instructions were provided to the                            patient.                           - Resume previous diet.                           - Continue present medications.                           - Await pathology results.                           - Repeat colonoscopy in 5 years for surveillance.                           - Return to GI clinic in 3 months. Procedure  Code(s):        --- Professional ---                           [id number], Colonoscopy, flexible; with removal of                            tumor(s), polyp(s), or other lesion(s) by snare                            technique                           45380, 59, Colonoscopy, flexible; with biopsy,                            single or multiple Diagnosis Code(s):        --- Professional ---                           Z12.11, Encounter for screening for malignant                            neoplasm of colon                           D12.3, Benign neoplasm of transverse colon (hepatic                            flexure or splenic flexure)                           K64.8, Other hemorrhoids CPT copyright 2022 American Medical  Association. All rights reserved. The codes documented in this report are preliminary and upon coder review may  be revised to meet current compliance requirements. Hennie Duos. Marletta Lor, DO Hennie Duos. Marletta Lor, DO 03/12/2023 11:38:56 AM This report has been signed electronically. Number of Addenda: 0

## 2023-03-12 NOTE — Discharge Instructions (Addendum)
EGD Discharge instructions Please read the instructions outlined below and refer to this sheet in the next few weeks. These discharge instructions provide you with general information on caring for yourself after you leave the hospital. Your doctor may also give you specific instructions. While your treatment has been planned according to the most current medical practices available, unavoidable complications occasionally occur. If you have any problems or questions after discharge, please call your doctor. ACTIVITY You may resume your regular activity but move at a slower pace for the next 24 hours.  Take frequent rest periods for the next 24 hours.  Walking will help expel (get rid of) the air and reduce the bloated feeling in your abdomen.  No driving for 24 hours (because of the anesthesia (medicine) used during the test).  You may shower.  Do not sign any important legal documents or operate any machinery for 24 hours (because of the anesthesia used during the test).  NUTRITION Drink plenty of fluids.  You may resume your normal diet.  Begin with a light meal and progress to your normal diet.  Avoid alcoholic beverages for 24 hours or as instructed by your caregiver.  MEDICATIONS You may resume your normal medications unless your caregiver tells you otherwise.  WHAT YOU CAN EXPECT TODAY You may experience abdominal discomfort such as a feeling of fullness or "gas" pains.  FOLLOW-UP Your doctor will discuss the results of your test with you.  SEEK IMMEDIATE MEDICAL ATTENTION IF ANY OF THE FOLLOWING OCCUR: Excessive nausea (feeling sick to your stomach) and/or vomiting.  Severe abdominal pain and distention (swelling).  Trouble swallowing.  Temperature over 101 F (37.8 C).  Rectal bleeding or vomiting of blood.    Colonoscopy Discharge Instructions  Read the instructions outlined below and refer to this sheet in the next few weeks. These discharge instructions provide you with  general information on caring for yourself after you leave the hospital. Your doctor may also give you specific instructions. While your treatment has been planned according to the most current medical practices available, unavoidable complications occasionally occur.   ACTIVITY You may resume your regular activity, but move at a slower pace for the next 24 hours.  Take frequent rest periods for the next 24 hours.  Walking will help get rid of the air and reduce the bloated feeling in your belly (abdomen).  No driving for 24 hours (because of the medicine (anesthesia) used during the test).   Do not sign any important legal documents or operate any machinery for 24 hours (because of the anesthesia used during the test).  NUTRITION Drink plenty of fluids.  You may resume your normal diet as instructed by your doctor.  Begin with a light meal and progress to your normal diet. Heavy or fried foods are harder to digest and may make you feel sick to your stomach (nauseated).  Avoid alcoholic beverages for 24 hours or as instructed.  MEDICATIONS You may resume your normal medications unless your doctor tells you otherwise.  WHAT YOU CAN EXPECT TODAY Some feelings of bloating in the abdomen.  Passage of more gas than usual.  Spotting of blood in your stool or on the toilet paper.  IF YOU HAD POLYPS REMOVED DURING THE COLONOSCOPY: No aspirin products for 7 days or as instructed.  No alcohol for 7 days or as instructed.  Eat a soft diet for the next 24 hours.  FINDING OUT THE RESULTS OF YOUR TEST Not all test results are available  during your visit. If your test results are not back during the visit, make an appointment with your caregiver to find out the results. Do not assume everything is normal if you have not heard from your caregiver or the medical facility. It is important for you to follow up on all of your test results.  SEEK IMMEDIATE MEDICAL ATTENTION IF: You have more than a spotting of  blood in your stool.  Your belly is swollen (abdominal distention).  You are nauseated or vomiting.  You have a temperature over 101.  You have abdominal pain or discomfort that is severe or gets worse throughout the day.   Your EGD revealed mild amount inflammation in your stomach.  I took biopsies of this to rule out infection with a bacteria called H. pylori.    At the end portion of your stomach you had what appeared to be a possible nodule versus prominent gastric fold.  I took biopsies of this as well.  Esophagus and small bowel appeared normal.  Your colonoscopy revealed 2 polyp(s) which I removed successfully. Await pathology results, my office will contact you. I recommend repeating colonoscopy in 5 years for surveillance purposes.   I also took samples of your colon due to your chronically loose stools. We will call with these results as well.   Follow up in GI office in 2-3 months.    I hope you have a great rest of your week!  Hennie Duos. Marletta Lor, D.O. Gastroenterology and Hepatology [facility]

## 2023-03-12 NOTE — Transfer of Care (Signed)
Immediate Anesthesia Transfer of Care Note  Patient: Claire Fowler  Procedure(s) Performed: COLONOSCOPY WITH PROPOFOL ESOPHAGOGASTRODUODENOSCOPY (EGD) WITH PROPOFOL BIOPSY POLYPECTOMY INTESTINAL  Patient Location: PACU  Anesthesia Type:MAC  Level of Consciousness: awake, alert , and oriented  Airway & Oxygen Therapy: Patient Spontanous Breathing  Post-op Assessment: Report given to RN and Post -op Vital signs reviewed and stable  Post vital signs: Reviewed and stable  Last Vitals:  Vitals Value Taken Time  BP 120/87 03/12/23 1141  Temp 36.7 C 03/12/23 1140  Pulse 84 03/12/23 1141  Resp 16 03/12/23 1141  SpO2 98 % 03/12/23 1141    Last Pain:  Vitals:   03/12/23 1140  TempSrc: Oral  PainSc: 0-No pain      Patients Stated Pain Goal: 8 (03/12/23 3244)  Complications: No notable events documented.

## 2023-03-12 NOTE — Interval H&P Note (Signed)
History and Physical Interval Note:  03/12/2023 10:59 AM  Claire Fowler  has presented today for surgery, with the diagnosis of screening colon cancer, weight loss, loss of appetite.  The various methods of treatment have been discussed with the patient and family. After consideration of risks, benefits and other options for treatment, the patient has consented to  Procedure(s) with comments: COLONOSCOPY WITH PROPOFOL (N/A) - 11:00 am, asa 2 ESOPHAGOGASTRODUODENOSCOPY (EGD) WITH PROPOFOL (N/A) as a surgical intervention.  The patient's history has been reviewed, patient examined, no change in status, stable for surgery.  I have reviewed the patient's chart and labs.  Questions were answered to the patient's satisfaction.     [name]

## 2023-03-12 NOTE — Op Note (Addendum)
[facility] Patient Name: Claire Fowler Procedure Date: 03/12/2023 11:02 AM MRN: [id number] Date of Birth: [birth year] Attending MD: Hennie Duos. Maple Mirza, [id number] CSN: [id number] Age: 57 Admit Type: Outpatient Procedure:                Upper GI endoscopy Indications:              Iron deficiency anemia, Anorexia, Weight loss Providers:                Hennie Duos. Marletta Lor, DO, Angelica Ran, Yolanda Bonine Referring MD:              Medicines:                See the Anesthesia note for documentation of the                            administered medications Complications:            No immediate complications. Estimated Blood Loss:     Estimated blood loss was minimal. Procedure:                Pre-Anesthesia Assessment:                           - The anesthesia plan was to use monitored                            anesthesia care (MAC).                           After obtaining informed consent, the endoscope was                            passed under direct vision. Throughout the                            procedure, the patient's blood pressure, pulse, and                            oxygen saturations were monitored continuously. The                            [id number]-H190 ([id number]) scope was introduced through the                            mouth, and advanced to the second part of duodenum.                            The upper GI endoscopy was accomplished without                            difficulty. The patient tolerated the procedure                            well.  Scope In: 11:12:56 AM Scope Out: 11:17:21 AM Total Procedure Duration: 0 hours 4 minutes 25 seconds  Findings:      The Z-line was regular and was found 36 cm from the incisors.      Patchy mild inflammation characterized by erythema was found in the       gastric body. Biopsies were taken with a cold forceps for Helicobacter       pylori testing.      A single 10 mm  submucosal papule (nodule) vs prominent fold with no       bleeding and no stigmata of recent bleeding was found at the pylorus.       Biopsies were taken with a cold forceps for histology.      The duodenal bulb, first portion of the duodenum and second portion of       the duodenum were normal. Impression:               - Z-line regular, 36 cm from the incisors.                           - Gastritis. Biopsied.                           - A single submucosal papule (nodule) vs prominent                            fold found in the stomach. Biopsied.                           - Normal duodenal bulb, first portion of the                            duodenum and second portion of the duodenum. Moderate Sedation:      Per Anesthesia Care Recommendation:           - Patient has a contact number available for                            emergencies. The signs and symptoms of potential                            delayed complications were discussed with the                            patient. Return to normal activities tomorrow.                            Written discharge instructions were provided to the                            patient.                           - Resume previous diet.                           - Continue present medications.                           -  Await pathology results.                           - Return to GI clinic in 3 months. Procedure Code(s):        --- Professional ---                           [phone], Esophagogastroduodenoscopy, flexible,                            transoral; with biopsy, single or multiple Diagnosis Code(s):        --- Professional ---                           K29.70, Gastritis, unspecified, without bleeding                           K31.89, Other diseases of stomach and duodenum                           D50.9, Iron deficiency anemia, unspecified                           R63.0, Anorexia                           R63.4, Abnormal weight  loss CPT copyright 2022 American Medical [employer]. All rights reserved. The codes documented in this report are preliminary and upon coder review may  be revised to meet current compliance requirements. Hennie Duos. Marletta Lor, DO Hennie Duos. Marletta Lor, DO 03/12/2023 11:20:41 AM This report has been signed electronically. Number of Addenda: 0

## 2023-03-12 NOTE — Anesthesia Preprocedure Evaluation (Signed)
Anesthesia Evaluation  Patient identified by MRN, date of birth, ID band Patient awake    Reviewed: Allergy & Precautions, H&P , NPO status , Patient's Chart, lab work & pertinent test results, reviewed documented beta blocker date and time   Airway Mallampati: II  TM Distance: >3 FB Neck ROM: full    Dental no notable dental hx.    Pulmonary neg pulmonary ROS, Current Smoker and Patient abstained from smoking.   Pulmonary exam normal breath sounds clear to auscultation       Cardiovascular Exercise Tolerance: Good hypertension, negative cardio ROS  Rhythm:regular Rate:Normal     Neuro/Psych  PSYCHIATRIC DISORDERS Anxiety Depression    negative neurological ROS  negative psych ROS   GI/Hepatic negative GI ROS, Neg liver ROS,,,  Endo/Other  negative endocrine ROS    Renal/GU negative Renal ROS  negative genitourinary   Musculoskeletal   Abdominal   Peds  Hematology negative hematology ROS (+) Blood dyscrasia, anemia   Anesthesia Other Findings   Reproductive/Obstetrics negative OB ROS                             Anesthesia Physical Anesthesia Plan  ASA: 2  Anesthesia Plan: General   Post-op Pain Management:    Induction:   PONV Risk Score and Plan: Propofol infusion  Airway Management Planned:   Additional Equipment:   Intra-op Plan:   Post-operative Plan:   Informed Consent: I have reviewed the patients History and Physical, chart, labs and discussed the procedure including the risks, benefits and alternatives for the proposed anesthesia with the patient or authorized representative who has indicated his/her understanding and acceptance.     Dental Advisory Given  Plan Discussed with: CRNA  Anesthesia Plan Comments:        Anesthesia Quick Evaluation

## 2023-03-13 LAB — GI PROFILE, STOOL, PCR

## 2023-03-13 LAB — IRON,TIBC AND FERRITIN PANEL

## 2023-03-13 LAB — FOLATE

## 2023-03-13 LAB — SURGICAL PATHOLOGY

## 2023-03-13 LAB — VITAMIN B12

## 2023-03-13 LAB — TISSUE TRANSGLUTAMINASE, IGA

## 2023-03-13 LAB — IGA

## 2023-03-13 NOTE — Anesthesia Postprocedure Evaluation (Signed)
Anesthesia Post Note  Patient: Claire Fowler  Procedure(s) Performed: COLONOSCOPY WITH PROPOFOL ESOPHAGOGASTRODUODENOSCOPY (EGD) WITH PROPOFOL BIOPSY POLYPECTOMY INTESTINAL  Patient location during evaluation: Phase II Anesthesia Type: General Level of consciousness: awake Pain management: pain level controlled Vital Signs Assessment: post-procedure vital signs reviewed and stable Respiratory status: spontaneous breathing and respiratory function stable Cardiovascular status: blood pressure returned to baseline and stable Postop Assessment: no headache and no apparent nausea or vomiting Anesthetic complications: no Comments: Late entry   No notable events documented.   Last Vitals:  Vitals:   03/12/23 1140 03/12/23 1141  BP: (!) 120/57 120/87  Pulse: 71 84  Resp: 18 16  Temp: 36.7 C   SpO2: 98% 98%    Last Pain:  Vitals:   03/12/23 1140  TempSrc: Oral  PainSc: 0-No pain                 Windell Norfolk

## 2023-03-17 ENCOUNTER — Other Ambulatory Visit: Payer: Self-pay | Admitting: Gastroenterology

## 2023-03-17 NOTE — Progress Notes (Signed)
error 

## 2023-03-21 ENCOUNTER — Telehealth: Payer: Self-pay | Admitting: Internal Medicine

## 2023-03-21 ENCOUNTER — Encounter (HOSPITAL_COMMUNITY): Payer: Self-pay | Admitting: Internal Medicine

## 2023-03-21 MED ORDER — TETRACYCLINE HCL 500 MG PO CAPS: 500.0000 mg | ORAL_CAPSULE | Freq: Four times a day (QID) | ORAL | 0 refills | Status: AC

## 2023-03-21 MED ORDER — BISMUTH 262 MG PO CHEW: 2.0000 | CHEWABLE_TABLET | Freq: Four times a day (QID) | ORAL | 0 refills | Status: AC

## 2023-03-21 MED ORDER — PANTOPRAZOLE SODIUM 40 MG PO TBEC: 40.0000 mg | DELAYED_RELEASE_TABLET | Freq: Two times a day (BID) | ORAL | 11 refills | Status: AC

## 2023-03-21 MED ORDER — METRONIDAZOLE 500 MG PO TABS: 500.0000 mg | ORAL_TABLET | Freq: Three times a day (TID) | ORAL | 0 refills | Status: AC

## 2023-03-21 NOTE — Telephone Encounter (Signed)
Noted  

## 2023-03-21 NOTE — Telephone Encounter (Signed)
Patient has H. pylori gastritis.  I will send in 14-day course of tetracycline 500 mg 4 times daily, metronidazole 500 mg 3 times daily.   Will send in pantoprazole 40 mg twice daily.   I will also send in bismuth to take 4 times daily as well.    Follow-up as previously scheduled to discuss eradication testing.  CC Baxter Hire  Thank you

## 2023-03-22 NOTE — Telephone Encounter (Signed)
Phoned the pt, no ans/vm set up

## 2023-03-26 NOTE — Telephone Encounter (Signed)
Phoned and LMOVM for the pt to return call

## 2023-03-27 NOTE — Telephone Encounter (Signed)
Letter mailed to the pt regarding call return, procedure notes and medications

## 2023-04-03 NOTE — Telephone Encounter (Signed)
Returned the pt's call and left a message where her Rx was sent to and that she may have waited and they put it back but if so she can call us and we would speak with them

## 2023-04-11 ENCOUNTER — Other Ambulatory Visit (HOSPITAL_COMMUNITY): Payer: Self-pay | Admitting: Adult Health

## 2023-04-11 DIAGNOSIS — Z1231 Encounter for screening mammogram for malignant neoplasm of breast: Secondary | ICD-10-CM

## 2023-04-25 ENCOUNTER — Ambulatory Visit: Payer: Medicaid Other | Admitting: Adult Health

## 2023-05-23 ENCOUNTER — Ambulatory Visit (INDEPENDENT_AMBULATORY_CARE_PROVIDER_SITE_OTHER): Payer: Medicaid Other | Admitting: Obstetrics & Gynecology

## 2023-05-23 ENCOUNTER — Encounter: Payer: Self-pay | Admitting: Obstetrics & Gynecology

## 2023-05-23 ENCOUNTER — Other Ambulatory Visit (HOSPITAL_COMMUNITY)
Admission: RE | Admit: 2023-05-23 | Discharge: 2023-05-23 | Disposition: A | Discharge status: Home / Self Care | Payer: Medicaid Other | Source: Ambulatory Visit | Attending: Obstetrics & Gynecology | Admitting: Obstetrics & Gynecology

## 2023-05-23 VITALS — BP 154/93 | HR 72 | Ht 62.0 in | Wt 156.0 lb

## 2023-05-23 DIAGNOSIS — Z01419 Encounter for gynecological examination (general) (routine) without abnormal findings: Secondary | ICD-10-CM

## 2023-05-23 DIAGNOSIS — N941 Unspecified dyspareunia: Secondary | ICD-10-CM

## 2023-05-23 DIAGNOSIS — N958 Other specified menopausal and perimenopausal disorders: Secondary | ICD-10-CM

## 2023-05-23 DIAGNOSIS — Z1231 Encounter for screening mammogram for malignant neoplasm of breast: Secondary | ICD-10-CM | POA: Diagnosis not present

## 2023-05-23 MED ORDER — PREMARIN 0.625 MG/GM VA CREA: 1.0000 | TOPICAL_CREAM | Freq: Every day | VAGINAL | 12 refills | Status: AC

## 2023-05-23 NOTE — Progress Notes (Signed)
Subjective:     Claire Fowler is a 57 y.o. female here for a routine exam.  No LMP recorded (lmp unknown). Patient is postmenopausal. G2P2 Birth Control Method:  BTL/menopausal Menstrual Calendar(currently): amenorrhea  Current complaints: none.   Current acute medical issues:  hypertension   Recent Gynecologic History No LMP recorded (lmp unknown). Patient is postmenopausal. Last Pap: 2001,  normal Last mammogram: 2017,  normal  Past Medical History:  Diagnosis Date   Anxiety    Hypertension 10-11-2014   Low back pain    Neck pain    Seasonal allergies     Past Surgical History:  Procedure Laterality Date   BIOPSY  03/12/2023   Procedure: BIOPSY;  Surgeon: [name], DO;  Location: [facility];  Service: Endoscopy;;   COLONOSCOPY WITH PROPOFOL N/A 03/12/2023   Procedure: COLONOSCOPY WITH PROPOFOL;  Surgeon: [name], DO;  Location: [facility];  Service: Endoscopy;  Laterality: N/A;  11:00 am, asa 2   ESOPHAGOGASTRODUODENOSCOPY (EGD) WITH PROPOFOL N/A 03/12/2023   Procedure: ESOPHAGOGASTRODUODENOSCOPY (EGD) WITH PROPOFOL;  Surgeon: [name], DO;  Location: [facility];  Service: Endoscopy;  Laterality: N/A;   POLYPECTOMY  03/12/2023   Procedure: POLYPECTOMY INTESTINAL;  Surgeon: [name], DO;  Location: [facility];  Service: Endoscopy;;   TUBAL LIGATION  08-1995    OB History     Gravida  2   Para      Term      Preterm      AB      Living  2      SAB      IAB      Ectopic      Multiple      Live Births              Social History   Socioeconomic History   Marital status: Single    Spouse name: Not on file   Number of children: Not on file   Years of education: Not on file   Highest education level: Not on file  Occupational History   Occupation: Film/video editor  Tobacco Use   Smoking status: Some Days    Current packs/day: 0.00    Types: Cigarettes, Cigars   Smokeless tobacco: Never   Tobacco  comments:    smokes 1 cigar every 3 days  Vaping Use   Vaping status: Never Used  Substance and Sexual Activity   Alcohol use: Yes    Comment: 5th of vodka   Drug use: No    Frequency: 0.2 times per week   Sexual activity: Yes    Birth control/protection: None  Other Topics Concern   Not on file  Social History Narrative   Not on file   Social Determinants of Health   Financial Resource Strain: Not on file  Food Insecurity: Not on file  Transportation Needs: Not on file  Physical Activity: Not on file  Stress: Stress Concern Present (11/02/2021)   [employer] of Occupational Health - Occupational Stress Questionnaire    Feeling of Stress : To some extent  Social Connections: Not on file    Family History  Problem Relation Age of Onset   Diabetes Maternal Grandmother    Pneumonia Father        Deceased   Diabetes Mother    Hypertension Mother    Lung cancer Sister    Liver cancer Sister    Colon cancer Neg Hx  Inflammatory bowel disease Neg Hx      Current Outpatient Medications:    chlorthalidone (HYGROTON) 25 MG tablet, Take 1 tablet (25 mg total) by mouth daily. Take 1 tablet by mouth daily; for bp replaces hctz, Disp: 90 tablet, Rfl: 1   Cholecalciferol (VITAMIN D3) 25 MCG (1000 UT) CAPS, Take by mouth. Once daily, Disp: , Rfl:    cyclobenzaprine (FLEXERIL) 10 MG tablet, Take 10 mg by mouth 2 (two) times daily as needed for muscle spasms., Disp: , Rfl:    diphenhydrAMINE (BENADRYL) 25 MG tablet, Take 50 mg by mouth at bedtime., Disp: , Rfl:    fenofibrate (TRICOR) 145 MG tablet, Take 1 tablet (145 mg total) by mouth daily., Disp: 90 tablet, Rfl: 1   ferrous fumarate (HEMOCYTE - 106 MG FE) 325 (106 FE) MG TABS tablet, Take 1 tablet by mouth daily., Disp: , Rfl:    FLUoxetine (PROZAC) 20 MG capsule, Take 20 mg by mouth daily., Disp: , Rfl:    fluticasone (FLONASE) 50 MCG/ACT nasal spray, Place into both nostrils daily., Disp: , Rfl:    folic acid (FOLVITE) 1  MG tablet, Take 1 mg by mouth daily., Disp: , Rfl:    ibuprofen (ADVIL) 600 MG tablet, Take 1 tablet (600 mg total) by mouth every 6 (six) hours as needed., Disp: 30 tablet, Rfl: 0   lisinopril (ZESTRIL) 10 MG tablet, Take 10 mg by mouth daily., Disp: , Rfl:    metoprolol tartrate (LOPRESSOR) 100 MG tablet, Take 1 tablet (100 mg total) by mouth 2 (two) times daily., Disp: 180 tablet, Rfl: 1   mirtazapine (REMERON) 15 MG tablet, , Disp: , Rfl:    Omega-3 Fatty Acids (FISH OIL) 1000 MG CAPS, Take by mouth. Once daily, Disp: , Rfl:    pantoprazole (PROTONIX) 40 MG tablet, Take 1 tablet (40 mg total) by mouth 2 (two) times daily., Disp: 60 tablet, Rfl: 11  Review of Systems  Review of Systems  Constitutional: Negative for fever, chills, weight loss, malaise/fatigue and diaphoresis.  HENT: Negative for hearing loss, ear pain, nosebleeds, congestion, sore throat, neck pain, tinnitus and ear discharge.   Eyes: Negative for blurred vision, double vision, photophobia, pain, discharge and redness.  Respiratory: Negative for cough, hemoptysis, sputum production, shortness of breath, wheezing and stridor.   Cardiovascular: Negative for chest pain, palpitations, orthopnea, claudication, leg swelling and PND.  Gastrointestinal: negative for abdominal pain. Negative for heartburn, nausea, vomiting, diarrhea, constipation, blood in stool and melena.  Genitourinary: Negative for dysuria, urgency, frequency, hematuria and flank pain.  Musculoskeletal: Negative for myalgias, back pain, joint pain and falls.  Skin: Negative for itching and rash.  Neurological: Negative for dizziness, tingling, tremors, sensory change, speech change, focal weakness, seizures, loss of consciousness, weakness and headaches.  Endo/Heme/Allergies: Negative for environmental allergies and polydipsia. Does not bruise/bleed easily.  Psychiatric/Behavioral: Negative for depression, suicidal ideas, hallucinations, memory loss and substance  abuse. The patient is not nervous/anxious and does not have insomnia.        Objective:  Blood pressure (!) 154/93, pulse 72, height 5\' 2"  (1.575 m), weight 156 lb (70.8 kg).   Physical Exam  Vitals reviewed. Constitutional: She is oriented to person, place, and time. She appears well-developed and well-nourished.  HENT:  Head: Normocephalic and atraumatic.        Right Ear: External ear normal.  Left Ear: External ear normal.  Nose: Nose normal.  Mouth/Throat: Oropharynx is clear and moist.  Eyes: Conjunctivae and EOM are normal.  Pupils are equal, round, and reactive to light. Right eye exhibits no discharge. Left eye exhibits no discharge. No scleral icterus.  Neck: Normal range of motion. Neck supple. No tracheal deviation present. No thyromegaly present.  Cardiovascular: Normal rate, regular rhythm, normal heart sounds and intact distal pulses.  Exam reveals no gallop and no friction rub.   No murmur heard. Respiratory: Effort normal and breath sounds normal. No respiratory distress. She has no wheezes. She has no rales. She exhibits no tenderness.  GI: Soft. Bowel sounds are normal. She exhibits no distension and no mass. There is no tenderness. There is no rebound and no guarding.  Genitourinary:  Breasts no masses skin changes or nipple changes bilaterally      Vulva is normal without lesions Vagina is pink moist without discharge Cervix normal in appearance and pap is done Uterus is normal size shape and contour Adnexa is negative with normal sized ovaries   Musculoskeletal: Normal range of motion. She exhibits no edema and no tenderness.  Neurological: She is alert and oriented to person, place, and time. She has normal reflexes. She displays normal reflexes. No cranial nerve deficit. She exhibits normal muscle tone. Coordination normal.  Skin: Skin is warm and dry. No rash noted. No erythema. No pallor.  Psychiatric: She has a normal mood and affect. Her behavior is normal.  Judgment and thought content normal.       Medications Ordered at today's visit: No orders of the defined types were placed in this encounter.   Other orders placed at today's visit: No orders of the defined types were placed in this encounter.    ASSESSMENT + PLAN:    ICD-10-CM   1. Well woman exam with routine gynecological exam  Z01.419     2. Genitourinary syndrome of menopause  N95.8     3. Dyspareunia, due to vaginal atrophy  N94.10           No follow-ups on file.

## 2023-05-23 NOTE — Addendum Note (Signed)
Addended by: Caralyn Guile on: 05/23/2023 03:29 PM   Modules accepted: Orders

## 2023-05-29 LAB — CYTOLOGY - PAP
Comment: NEGATIVE
Comment: NEGATIVE
Comment: NEGATIVE
Diagnosis: UNDETERMINED — AB
HPV 16: NEGATIVE
HPV 18 / 45: NEGATIVE
High risk HPV: POSITIVE — AB

## 2023-06-17 ENCOUNTER — Ambulatory Visit: Payer: Medicaid Other | Admitting: Gastroenterology

## 2024-02-26 ENCOUNTER — Encounter (HOSPITAL_COMMUNITY): Payer: Self-pay
